# Patient Record
Sex: Female | Born: 1935 | Race: White | Hispanic: No | Smoking: Former smoker
Health system: Southern US, Community
[De-identification: ages and names within clinical notes are randomized; demographics above are authoritative.]

## PROBLEM LIST (undated history)

## (undated) DIAGNOSIS — C50919 Malignant neoplasm of unspecified site of unspecified female breast: Secondary | ICD-10-CM

## (undated) DIAGNOSIS — M1990 Unspecified osteoarthritis, unspecified site: Secondary | ICD-10-CM

## (undated) DIAGNOSIS — F329 Major depressive disorder, single episode, unspecified: Secondary | ICD-10-CM

## (undated) DIAGNOSIS — I219 Acute myocardial infarction, unspecified: Secondary | ICD-10-CM

## (undated) DIAGNOSIS — R011 Cardiac murmur, unspecified: Secondary | ICD-10-CM

## (undated) DIAGNOSIS — E785 Hyperlipidemia, unspecified: Secondary | ICD-10-CM

## (undated) DIAGNOSIS — R0602 Shortness of breath: Secondary | ICD-10-CM

## (undated) DIAGNOSIS — R51 Headache: Secondary | ICD-10-CM

## (undated) DIAGNOSIS — F32A Depression, unspecified: Secondary | ICD-10-CM

## (undated) DIAGNOSIS — C3490 Malignant neoplasm of unspecified part of unspecified bronchus or lung: Secondary | ICD-10-CM

## (undated) DIAGNOSIS — C6990 Malignant neoplasm of unspecified site of unspecified eye: Secondary | ICD-10-CM

## (undated) DIAGNOSIS — I10 Essential (primary) hypertension: Secondary | ICD-10-CM

## (undated) DIAGNOSIS — I2510 Atherosclerotic heart disease of native coronary artery without angina pectoris: Secondary | ICD-10-CM

## (undated) HISTORY — PX: CARDIAC CATHETERIZATION: SHX172

## (undated) HISTORY — DX: Essential (primary) hypertension: I10

## (undated) HISTORY — DX: Malignant neoplasm of unspecified site of unspecified eye: C69.90

## (undated) HISTORY — DX: Malignant neoplasm of unspecified site of unspecified female breast: C50.919

## (undated) HISTORY — DX: Atherosclerotic heart disease of native coronary artery without angina pectoris: I25.10

## (undated) HISTORY — PX: MASS EXCISION: SHX2000

## (undated) HISTORY — DX: Hyperlipidemia, unspecified: E78.5

## (undated) HISTORY — DX: Malignant neoplasm of unspecified part of unspecified bronchus or lung: C34.90

## (undated) HISTORY — PX: EYE SURGERY: SHX253

---

## 1988-10-31 HISTORY — PX: MASTECTOMY: SHX3

## 2011-05-06 ENCOUNTER — Inpatient Hospital Stay (HOSPITAL_COMMUNITY)
Admission: EM | Admit: 2011-05-06 | Discharge: 2011-05-11 | DRG: 247 | Disposition: A | Discharge status: Home / Self Care | Payer: Medicare Other | Attending: Cardiovascular Disease | Admitting: Cardiovascular Disease

## 2011-05-06 DIAGNOSIS — R079 Chest pain, unspecified: Secondary | ICD-10-CM

## 2011-05-06 DIAGNOSIS — I2582 Chronic total occlusion of coronary artery: Secondary | ICD-10-CM | POA: Diagnosis present

## 2011-05-06 DIAGNOSIS — I2510 Atherosclerotic heart disease of native coronary artery without angina pectoris: Secondary | ICD-10-CM | POA: Diagnosis present

## 2011-05-06 DIAGNOSIS — Z85840 Personal history of malignant neoplasm of eye: Secondary | ICD-10-CM

## 2011-05-06 DIAGNOSIS — Z853 Personal history of malignant neoplasm of breast: Secondary | ICD-10-CM

## 2011-05-06 DIAGNOSIS — E785 Hyperlipidemia, unspecified: Secondary | ICD-10-CM | POA: Diagnosis present

## 2011-05-06 DIAGNOSIS — I2119 ST elevation (STEMI) myocardial infarction involving other coronary artery of inferior wall: Principal | ICD-10-CM | POA: Diagnosis present

## 2011-05-06 DIAGNOSIS — I10 Essential (primary) hypertension: Secondary | ICD-10-CM | POA: Diagnosis present

## 2011-05-06 LAB — CBC
HCT: 35.9 % — ABNORMAL LOW (ref 36.0–46.0)
HCT: 27 % — ABNORMAL LOW (ref 36.0–46.0)
HCT: 32.1 % — ABNORMAL LOW (ref 36.0–46.0)
Hemoglobin: 9.4 g/dL — ABNORMAL LOW (ref 12.0–15.0)
MCH: 30.8 pg (ref 26.0–34.0)
MCH: 30.5 pg (ref 26.0–34.0)
MCHC: 33.6 g/dL (ref 30.0–36.0)
MCV: 88.4 fL (ref 78.0–100.0)
MCV: 87.7 fL (ref 78.0–100.0)
Platelets: 190 10*3/uL (ref 150–400)
Platelets: 171 10*3/uL (ref 150–400)
Platelets: 183 10*3/uL (ref 150–400)
RBC: 4.06 MIL/uL (ref 3.87–5.11)
RBC: 3.08 MIL/uL — ABNORMAL LOW (ref 3.87–5.11)
RDW: 14.6 % (ref 11.5–15.5)
WBC: 6.6 10*3/uL (ref 4.0–10.5)
WBC: 8.3 10*3/uL (ref 4.0–10.5)

## 2011-05-06 LAB — COMPREHENSIVE METABOLIC PANEL
ALT: 14 U/L (ref 0–35)
AST: 22 U/L (ref 0–37)
Albumin: 2.5 g/dL — ABNORMAL LOW (ref 3.5–5.2)
Calcium: 7.7 mg/dL — ABNORMAL LOW (ref 8.4–10.5)
Creatinine, Ser: 0.72 mg/dL (ref 0.50–1.10)
GFR calc non Af Amer: >60 mL/min (ref >60)
Sodium: 137 mEq/L (ref 135–145)
Total Protein: 5.9 g/dL — ABNORMAL LOW (ref 6.0–8.3)

## 2011-05-06 LAB — HEMOGLOBIN A1C: Hgb A1c MFr Bld: 6.2 % — ABNORMAL HIGH (ref <5.7)

## 2011-05-06 LAB — CARDIAC PANEL(CRET KIN+CKTOT+MB+TROPI)
CK, MB: 19.3 ng/mL (ref 0.3–4.0)
Relative Index: 8.6 — ABNORMAL HIGH (ref 0.0–2.5)
Total CK: 224 U/L — ABNORMAL HIGH (ref 7–177)
Troponin I: 1.42 ng/mL (ref <0.30)

## 2011-05-06 LAB — LIPID PANEL
HDL: 35 mg/dL — ABNORMAL LOW (ref >39)
LDL Cholesterol: 112 mg/dL — ABNORMAL HIGH (ref 0–99)
Total CHOL/HDL Ratio: 4.6 RATIO
Triglycerides: 69 mg/dL (ref <150)

## 2011-05-06 LAB — DIFFERENTIAL
Eosinophils Absolute: 0 10*3/uL (ref 0.0–0.7)
Eosinophils Relative: 1 % (ref 0–5)
Lymphocytes Relative: 13 % (ref 12–46)
Lymphs Abs: 1.1 10*3/uL (ref 0.7–4.0)
Monocytes Relative: 3 % (ref 3–12)
Neutrophils Relative %: 84 % — ABNORMAL HIGH (ref 43–77)

## 2011-05-06 LAB — BASIC METABOLIC PANEL
CO2: 24 mEq/L (ref 19–32)
Calcium: 9.2 mg/dL (ref 8.4–10.5)
Chloride: 103 mEq/L (ref 96–112)
Glucose, Bld: 194 mg/dL — ABNORMAL HIGH (ref 70–99)
Sodium: 138 mEq/L (ref 135–145)

## 2011-05-06 LAB — MRSA PCR SCREENING: MRSA by PCR: NEGATIVE

## 2011-05-06 LAB — POCT I-STAT, CHEM 8
BUN: 14 mg/dL (ref 6–23)
Calcium, Ion: 1.13 mmol/L (ref 1.12–1.32)
Chloride: 107 mEq/L (ref 96–112)
Glucose, Bld: 200 mg/dL — ABNORMAL HIGH (ref 70–99)
Potassium: 3.5 mEq/L (ref 3.5–5.1)

## 2011-05-06 LAB — APTT
aPTT: 27 seconds (ref 24–37)
aPTT: 133 seconds — ABNORMAL HIGH (ref 24–37)

## 2011-05-07 LAB — CARDIAC PANEL(CRET KIN+CKTOT+MB+TROPI): Relative Index: 7.4 — ABNORMAL HIGH (ref 0.0–2.5)

## 2011-05-07 LAB — BASIC METABOLIC PANEL
BUN: 12 mg/dL (ref 6–23)
CO2: 25 mEq/L (ref 19–32)
CO2: 28 mEq/L (ref 19–32)
Calcium: 8.6 mg/dL (ref 8.4–10.5)
Chloride: 108 mEq/L (ref 96–112)
Chloride: 107 mEq/L (ref 96–112)
Creatinine, Ser: 0.76 mg/dL (ref 0.50–1.10)
Glucose, Bld: 83 mg/dL (ref 70–99)
Potassium: 3.7 mEq/L (ref 3.5–5.1)
Sodium: 141 mEq/L (ref 135–145)

## 2011-05-07 LAB — CBC
HCT: 28.9 % — ABNORMAL LOW (ref 36.0–46.0)
MCH: 30.6 pg (ref 26.0–34.0)
MCV: 88.4 fL (ref 78.0–100.0)
RBC: 3.27 MIL/uL — ABNORMAL LOW (ref 3.87–5.11)
WBC: 6.3 10*3/uL (ref 4.0–10.5)

## 2011-05-08 LAB — BASIC METABOLIC PANEL
CO2: 25 mEq/L (ref 19–32)
Chloride: 106 mEq/L (ref 96–112)
GFR calc non Af Amer: >60 mL/min (ref >60)
Glucose, Bld: 94 mg/dL (ref 70–99)
Potassium: 3.7 mEq/L (ref 3.5–5.1)
Sodium: 139 mEq/L (ref 135–145)

## 2011-05-08 LAB — CARDIAC PANEL(CRET KIN+CKTOT+MB+TROPI)
CK, MB: 27.8 ng/mL (ref 0.3–4.0)
Relative Index: 3.3 — ABNORMAL HIGH (ref 0.0–2.5)
Troponin I: 14.2 ng/mL (ref <0.30)

## 2011-05-09 LAB — CBC
MCH: 29.8 pg (ref 26.0–34.0)
Platelets: 161 10*3/uL (ref 150–400)
RBC: 3.26 MIL/uL — ABNORMAL LOW (ref 3.87–5.11)
WBC: 5.6 10*3/uL (ref 4.0–10.5)

## 2011-05-09 LAB — PROTIME-INR: Prothrombin Time: 14.2 seconds (ref 11.6–15.2)

## 2011-05-09 LAB — POCT ACTIVATED CLOTTING TIME: Activated Clotting Time: 358 seconds

## 2011-05-10 LAB — CBC
HCT: 28.7 % — ABNORMAL LOW (ref 36.0–46.0)
MCV: 88.9 fL (ref 78.0–100.0)
RDW: 14.6 % (ref 11.5–15.5)
WBC: 5.4 10*3/uL (ref 4.0–10.5)

## 2011-05-10 LAB — BASIC METABOLIC PANEL
BUN: 19 mg/dL (ref 6–23)
CO2: 26 mEq/L (ref 19–32)
Chloride: 106 mEq/L (ref 96–112)
Creatinine, Ser: 0.8 mg/dL (ref 0.50–1.10)
GFR calc Af Amer: >60 mL/min (ref >60)

## 2011-05-11 DIAGNOSIS — I2119 ST elevation (STEMI) myocardial infarction involving other coronary artery of inferior wall: Secondary | ICD-10-CM

## 2011-05-11 LAB — CBC
HCT: 28.5 % — ABNORMAL LOW (ref 36.0–46.0)
MCH: 30.6 pg (ref 26.0–34.0)
MCHC: 34.4 g/dL (ref 30.0–36.0)
MCV: 89.1 fL (ref 78.0–100.0)
RDW: 14.6 % (ref 11.5–15.5)

## 2011-05-11 LAB — BASIC METABOLIC PANEL
BUN: 21 mg/dL (ref 6–23)
Calcium: 8.4 mg/dL (ref 8.4–10.5)
Creatinine, Ser: 0.84 mg/dL (ref 0.50–1.10)
GFR calc Af Amer: >60 mL/min (ref >60)
GFR calc non Af Amer: >60 mL/min (ref >60)

## 2011-05-18 ENCOUNTER — Encounter: Payer: Self-pay | Admitting: Cardiovascular Disease

## 2011-05-19 ENCOUNTER — Other Ambulatory Visit (INDEPENDENT_AMBULATORY_CARE_PROVIDER_SITE_OTHER): Payer: Medicare Other | Admitting: *Deleted

## 2011-05-19 ENCOUNTER — Ambulatory Visit (INDEPENDENT_AMBULATORY_CARE_PROVIDER_SITE_OTHER): Payer: Medicare Other | Admitting: Cardiovascular Disease

## 2011-05-19 ENCOUNTER — Encounter: Payer: Self-pay | Admitting: Cardiovascular Disease

## 2011-05-19 VITALS — BP 133/83 | HR 72 | Ht 66.0 in | Wt 152.0 lb

## 2011-05-19 DIAGNOSIS — E7800 Pure hypercholesterolemia, unspecified: Secondary | ICD-10-CM | POA: Insufficient documentation

## 2011-05-19 DIAGNOSIS — I2510 Atherosclerotic heart disease of native coronary artery without angina pectoris: Secondary | ICD-10-CM

## 2011-05-19 DIAGNOSIS — I2119 ST elevation (STEMI) myocardial infarction involving other coronary artery of inferior wall: Secondary | ICD-10-CM

## 2011-05-19 NOTE — Assessment & Plan Note (Signed)
LDL cholesterol was 112. She was started on simvastatin in the hospital.

## 2011-05-19 NOTE — Cardiovascular Report (Signed)
NAMEJALEEYAH, MUNCE NO.:  [id number]  MEDICAL RECORD NO.:  [id number]  LOCATION:  2902                         FACILITY:  [facility]  PHYSICIAN:  Veverly Fells. Excell Seltzer, MD  DATE OF BIRTH:  17-May-1934  DATE OF PROCEDURE:  05/06/2011 DATE OF DISCHARGE:                           CARDIAC CATHETERIZATION   PROCEDURES: 1. Left heart catheterization. 2. Selective coronary angiography. 3. Left ventricular angiography. 4. percutaneous transluminal coronary angioplasty and stenting of the     right coronary artery.  PROCEDURAL INDICATIONS:  Inferoposterior MI.  PROCEDURAL DETAILS:  Risk and indications of the procedure were reviewed with the patient.  Emergency consent was obtained.  The right groin was prepped, draped and anesthetized with 1% lidocaine using modified Seldinger technique.  A 6-French sheath was placed in the right femoral artery.  The patient had an inferoposterior infarct, so I elected to initially go in with a JL-4 diagnostic catheter to image the left coronary artery.  A 6-French JR-4 guide catheter was used for the right coronary artery.  The patient was started immediately on bivalirudin. She was also loaded with Plavix 600 mg on the table.  There was high- grade stenosis in both the LAD and left circumflex but there was TIMI 3 flow present in both vessels.  I suspected the right was the culprit. There was TIMI 2 flow in the right coronary artery with a critical distal RCA stenosis.  The vessel was wired with a cougar guidewire after a therapeutic ACT was achieved.  The vessel was predilated with a 2.0 x 20-mm Emerge balloon which was taken to 6 and 8 atmospheres over the lesions.  There were sequential high grade stenoses.  The PDA was patent but the posterolateral branch appear to have abrupt occlusion, probably related to thrombus formation.  There is also severe stenosis in another small distal RCA branch that was left untreated.  The patient  was eventually started on Integrilin due to the heavy clot burden in the distal vessel.  After several angiographic images were obtained, a 2.5 x 24-mm Promus drug-eluting stent was carefully positioned at the distal right coronary artery just before the bifurcation of the PDA branch. This covered the lesion in its entirety.  There was a 50% lesion at the origin of the first RV marginal branch that I did not think needed to be treated.  The stent was deployed at 12 atmospheres and appeared well expanded.  It was then postdilated with a 2.75 x 20-mm Clifton Quantum Apex to 14 atmospheres.  There was an excellent angiographic result.  The first posterolateral branch had improvement in the flow.  The second posterolateral branch continued to be occluded.  The PDA branch had TIMI 3 flow.  Left ventriculography was then performed.  Pullback across the aortic valve was done.  The femoral arteriotomy was closed with Perclose device.  The patient tolerated the procedure well.  At the completion of the procedure, her chest pain was down to 1/10.  PROCEDURAL FINDINGS:  The aortic pressure 101/61 with a mean of 78, left ventricular pressure is 99/20.  Left mainstem is patent.  There is mild calcification, the left main divides into the LAD and left  circumflex.  The mainstem has diffuse irregularity but no significant stenosis.  LAD.  The LAD has diffuse luminal irregularities in the proximal aspect. There is no high-grade stenosis present.  There is calcification and 30- 40% stenoses.  The mid-LAD has an eccentric 80% plaque.  There is a second 80% stenosis at the junction of the mid and distal LAD.  The vessel goes down to wrap around the left ventricular apex.  The first diagonal branch has moderate diffuse stenosis of 70%.  Left circumflex.  The left circumflex has high-grade proximal stenosis of 90%.  There is calcification and eccentricity to the plaque.  The vessel supplies a large single  obtuse marginal branch.  Right coronary artery.  The RCA is patent.  Proximally there is a 50% focal stenosis in the midportion.  The distal portion has diffuse critical disease with very high-grade stenosis of 95%.  There is TIMI 2 flow beyond that.  The vessel divides into a PDA and 2 posterolateral branches that fill competitively from antegrade flow and left-to-right collaterals.  Left ventriculography shows mild inferobasal hypokinesis with left ventricular ejection fraction estimated at 50%.  FINAL ASSESSMENT: 1. Acute inferoposterior myocardial infarction secondary to subtotal     occlusion of the right coronary artery with successful primary     percutaneous intervention using a drug-eluting stent platform. 2. Severe proximal left circumflex stenosis. 3. Severe tandem lesions in the mid left anterior descending. 4. Mild segmental left ventricular contraction abnormality with     overall preserved left ventricular function.  RECOMMENDATIONS:  The patient will be monitored in the CCU.  She will receive post MI medical therapy.  We will review films with colleagues, but my initial impression is that she would be best treated with multivessel PCI.  Her proximal circumflex is a focal lesion that could likely have a very good stent out, the mid-LAD has tandem lesions around the area of potential LIMA insertion making in my opinion bypass less favorable.  Initially, she will be monitored and we will follow her clinical course and tentatively we will plan on staged PCI next week.     Veverly Fells. Excell Seltzer, MD     MDC/MEDQ  D:  05/06/2011  T:  05/07/2011  Job:  [id number]  Electronically Signed by Tonny Bollman MD on 05/19/2011 12:40:55 AM

## 2011-05-19 NOTE — Progress Notes (Signed)
HPI:  The This is a 77 year old woman presenting for followup evaluation. She was hospitalized July 6 or July 11 with an acute inferoposterior MI. She was initially treated with primary PCI using a drug-eluting stent to the distal right coronary artery. She also was found to have multivessel disease with severe focal stenosis of the left circumflex and sequential lesions in the mid and distal LAD. Her circumflex was treated in staged fashion with a drug-eluting stent. Her LAD was treated medically because of diffuse moderate disease. The patient presents today for followup.  She's been staying with her son in [place]. She lives around [place]. She's been walking at a slow pace 15-20 minutes at a time and denies any symptoms with walking. She does get tired but does not have specific symptoms of shortness of breath or chest pain or tightness. She has no symptoms reminiscent of her MI. She notes easy bruising. She denies edema, orthopnea, PND, palpitations, lightheadedness, or syncope. She has a lot of questions today about the safety of returning home. [place].  The patient had P2Y12 testing done in the hospital and this demonstrated a baseline PRU of 499, inhibited PRU of 336, with 33% platelet inhibition.  Outpatient Encounter Prescriptions as of 05/19/2011  Medication Sig Dispense Refill  . aspirin 81 MG tablet Take 81 mg by mouth daily.        . carvedilol (COREG) 3.125 MG tablet Take 3.125 mg by mouth 2 (two) times daily with a meal.        . clopidogrel (PLAVIX) 75 MG tablet Take 75 mg by mouth daily.        . nitroGLYCERIN (NITROSTAT) 0.4 MG SL tablet Place 0.4 mg under the tongue every 5 (five) minutes as needed. For chest pains up to 3 doses. If pain still persist call emergency personal.       . simvastatin (ZOCOR) 40 MG tablet Take 40 mg by mouth at bedtime.          Allergies  Allergen Reactions  . Penicillins Hives    Past Medical History  Diagnosis  Date  . Hypertension   . Dyslipidemia   . Breast cancer   . Cancer of eye   . Myocardial infarct     ROS: Negative except as per HPI  BP 133/83  Pulse 72  Ht 5\' 6"  (1.676 m)  Wt 152 lb (68.947 kg)  BMI 24.53 kg/m2  PHYSICAL EXAM: Pt is alert and oriented, elderly woman in NAD HEENT: normal Neck: JVP - normal, carotids 2+= without bruits Lungs: CTA bilaterally CV: RRR without murmur or gallop Abd: soft, NT, Positive BS, no hepatomegaly Ext: no C/C/E, distal pulses intact and equal. Ecchymoses noted on both lower legs, nontender.  Skin: warm/dry no rash  EKG:  Normal sinus rhythm with possible left atrial enlargement, T wave abnormality consider inferior ischemia.  ASSESSMENT AND PLAN:

## 2011-05-19 NOTE — Patient Instructions (Signed)
Your physician recommends that you schedule a follow-up appointment in: 6-8 weeks with Dr. Excell Seltzer.  Your physician has requested that you have a lexiscan myoview. For further information please visit [email or web address] Please follow instruction sheet, as given.  You had lab work done today.

## 2011-05-19 NOTE — H&P (Signed)
NAMECRISTEN, [name] NO.:  [id number]  MEDICAL RECORD NO.:  [id number]  LOCATION:  2902                         FACILITY:  [facility]  PHYSICIAN:  Veverly Fells. Excell Seltzer, MD  DATE OF BIRTH:  Nov 30, 1933  DATE OF ADMISSION:  05/06/2011 DATE OF DISCHARGE:                             HISTORY & PHYSICAL   REASON FOR ADMISSION:  Chest pain.  HISTORY OF PRESENT ILLNESS:  Ms. [name] is a 77 year old woman who resides in [place].  She is temporarily in [place] visiting her son. She developed substernal chest pressure, diaphoresis, nausea, and vomiting this morning at 10:00 a.m.  She called her sister who came over to check on her.  They went to [facility] and she underwent a brief evaluation there and she then was advised to go directly to the [facility] Emergency Department.  EMS was reportedly offered to the patient, but she declined.  Upon arrival to the emergency department, an EKG was done in the triage area and this documented ST elevation infarction.  A Code STEMI was called and the patient was brought emergently to the cardiac cath lab.  Upon arrival to the cardiac cath lab, the patient complained of continued 5/10 chest discomfort.  She reports no anginal symptoms prior to her episode this morning.  She has no past cardiac history.  She has no other acute complaints.  She specifically denies dyspnea, palpitations, or syncope.  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  No other hospitalizations or surgeries are reported.  MEDICATIONS:  Currently unknown.  ALLERGIES:  PENICILLIN causes hives.  FAMILY HISTORY:  Negative for premature coronary artery disease.  SOCIAL HISTORY:  The patient lives in [place].  Her son lives locally in [place].  The patient is retired from a [employer].  She was long- time smoker  but quit about 20 years ago.  She does not drink alcohol.  REVIEW OF SYSTEMS:  Negative except as per HPI.  PHYSICAL EXAMINATION:  GENERAL:  The patient is alert and oriented, elderly woman in mild distress. HEENT: Normal. NECK:  Normal carotid upstrokes.  No bruits appreciated.  JVP normal. No thyromegaly or thyroid nodules. LUNGS: Clear to auscultation bilaterally. HEART:  Regular rate and rhythm.  No murmurs or gallops. ABDOMEN:  Soft, nontender.  No organomegaly. BACK:  No CVA tenderness. EXTREMITIES:  No clubbing, cyanosis, or edema.  Peripheral pulses are 2+ and equal. SKIN:  Warm and dry without rash.  EKG shows normal sinus rhythm with acute inferoposterior injury.  Lab work is currently pending.  Chest x-ray is currently pending.  ASSESSMENT: 1. This is a 77 year old woman with an acute inferoposterior     myocardial infarction.  Risks and indication of cardiac cath plus     or minus percutaneous coronary intervention were reviewed with the     patient.  Emergency consent was obtained.  Further plans pending     the result of her cardiac cath. 2. Hypertension.  I do not know the patient's home medications.  We     will obtain these for review and  treat her with appropriate post     myocardial infarction medical therapy, which likely will clued     include a beta-blocker and ACE inhibitor. 3. Hyperlipidemia.  We will repeat lipids and start high-dose statin     therapy.     Veverly Fells. Excell Seltzer, MD     MDC/MEDQ  D:  05/06/2011  T:  05/07/2011  Job:  [id number]  Electronically Signed by Tonny Bollman MD on 05/19/2011 12:40:45 AM

## 2011-05-19 NOTE — Discharge Summary (Signed)
[name], [name] NO.:  [id number]  MEDICAL RECORD NO.:  [id number]  LOCATION:  2008                         FACILITY:  [facility]  PHYSICIAN:  Arturo Morton. Riley Kill, MD, FACCDATE OF BIRTH:  [birth year]  DATE OF ADMISSION:  05/06/2011 DATE OF DISCHARGE:  05/11/2011                              DISCHARGE SUMMARY   PRIMARY CARDIOLOGIST:  Veverly Fells. Excell Seltzer, MD  DISCHARGE DIAGNOSIS:  Acute inferoposterior myocardial infarction.  SECONDARY DIAGNOSES: 1. Coronary artery disease, status post successful percutaneous     coronary intervention and drug-eluting stent placement to the     distal right coronary artery with elective drug-eluting stent     placement to the left circumflex this admission. 2. History of hypertension. 3. Hyperlipidemia. 4. History of breast cancer, status post bilateral mastectomy. 5. 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  ALLERGIES:  PENICILLIN causes hives.  PROCEDURES: 1. Left heart cardiac catheterization performed emergently on May 06, 2011, revealing 10-99% stenosis in the distal RCA as well as a 90%     proximal stenosis in the left circumflex and 80% stenosis in the     LAD.  EF was 50% with moderate inferobasal hypokinesis.  The RCA     was felt to be the infarct vessel and this was successfully stented     with a 2.5 x 24-mm Promus Element Plus drug-eluting stent. 2. Stage percutaneous intervention to the left circumflex with     placement of 2.75 x 20-mm Promus Element Plus drug-eluting stent.     Relook catheterization on the right coronary artery showed     resolution of thrombus with patent stent.  The left LAD was     medically managed.  HISTORY OF PRESENT ILLNESS:  A 77 year old female without prior cardiac history who lives in [place], but is temporarily residing with her son. She was in her usual state of health until the morning of May 06, 2011, when she had a sudden onset of substernal chest  discomfort, diaphoresis, nausea, and vomiting.  The patient presented to [facility] and was advised to present to the [facility] ED.  EMS was reportedly offered, but the patient declined.  Upon arrival to the ED, the patient was noted to have inferior ST-segment elevation with an anterolateral ST-segment depression.  Code STEMI was called and the patient was taken to the Cath Lab emergently.  HOSPITAL COURSE:  The patient underwent emergent diagnostic catheterization revealing subtotal occlusion of the distal RCA along with 90% proximal stenosis in the left circumflex and 80% mid stenosis in the LAD.  The RCA was felt to be the infarct vessel and this was successfully stented with a 2.5 x 24-mm Promus Element Plus drug-eluting stent.  Films were reviewed and it was felt that staged PCI of the left circumflex and possibly LAD would be necessary.  The patient was monitored in the coronary intensive care unit postprocedurally where she eventually peaked her CK at 2149, MB at 159.2, and troponin-I greater than 25.  She was maintained on aspirin, statin, Plavix, and low-dose beta-blocker therapy all of which she has tolerated.  She went back to the  Cath Lab on May 09, 2011 where PCI of the proximal circumflex was performed with placement of 2.75 x 20-mm Promus Element Plus drug-eluting stent.  Relook angiography of the right coronary artery showed less than 20% in-stent restenosis with resolution of prior thrombus.  For the time being, the LAD was left for medical therapy.  Postprocedure, the patient has had no recurrent chest discomfort.  P2Y12 testing was performed showing 33% inhibition, which was felt to be adequate and Plavix was continued.  The patient has been seen by Cardiac Rehab and has been ambulating around floor without difficulty and will be discharged home today in good condition.  DISCHARGE LABS:  Hemoglobin 9.8, hematocrit 28.5, WBC 5.4, platelets 164, P2Y12  336 with 33% inhibition.  Sodium 139, potassium 4.1, chloride 108, CO2 25, BUN 21, creatinine 0.84, glucose 94.  Total bilirubin 0.1, alkaline phosphatase 55, AST 22, ALT 14, total protein 5.9, albumin 2.5, calcium 8.4, hemoglobin A1c 6.2.  CK 51, MB 27.8, troponin-I 14.20. Total cholesterol 161, triglycerides 69, HDL 35, LDL 112.  MRSA screen was negative.  DISPOSITION:  The patient will be discharged home today in good condition.  FOLLOWUP PLANS AND APPOINTMENTS:  We have arranged a follow up with Dr. Tonny Bollman on May 19, 2011 at 3:15 p.m.  The patient wants to CBC performed during that visit.  DISCHARGE MEDICATIONS: 1. Aspirin 81 mg daily. 2. Carvedilol 3.125 mg b.i.d. 3. Plavix 75 mg daily. 4. Nitroglycerin 0.4 mg sublingual p.r.n. chest pain. 5. Simvastatin 40 mg nightly.  OUTSTANDING LABS STUDIES:  Follow up lipids and LFTs in 6-8 weeks given new statin therapy.  CBC on May 19, 2011.  DURATION OF DISCHARGE ENCOUNTER:  Sixty minutes including physician time.     [name], ANP   ______________________________ Arturo Morton. Riley Kill, MD, [facility]    CB/MEDQ  D:  05/11/2011  T:  05/12/2011  Job:  [id number]  Electronically Signed by [name] ANP on 05/17/2011 04:48:00 PM Electronically Signed by Shawnie Pons MD [facility] on 05/19/2011 07:25:48 AM

## 2011-05-19 NOTE — Cardiovascular Report (Signed)
[name], [name] NO.:  [id number]  MEDICAL RECORD NO.:  [id number]  LOCATION:  2902                         FACILITY:  [facility]  PHYSICIAN:  Arturo Morton. Riley Kill, MD, FACCDATE OF BIRTH:  07-21-1934  DATE OF PROCEDURE: DATE OF DISCHARGE:                           CARDIAC CATHETERIZATION   INDICATIONS:  This nice lady presented on May 06, 2011 with inferoposterior wall myocardial infarction.  She underwent direct stenting.  She had multivessel disease with a critical lesion of the circumflex and multiple moderately high-grade lesions of the LAD.  It was felt that it would be difficult to discharge her from the hospital with the circumflex which is about 95% hazy.  I reviewed the findings with the patient and her family.  She was agreeable to proceed.  PROCEDURES: 1. Coronary arteriography. 2. Percutaneous stenting of the circumflex coronary artery with DES.  DESCRIPTION OF THE PROCEDURE:  The patient was brought to the catheterization laboratory and prepped and draped in the usual fashion. Through an anterior puncture, the left femoral artery was entered.  A 6- French sheath was placed.  Bivalirudin was given according to protocol. We used a diagnostic light to demonstrate continued patency of the infarct-related artery.  Attention was then turned to the left coronary system.  A CLS 3.5 guide was utilized.  ACT was checked and found to be appropriate.  A critical stenosis in the circumflex artery was noted as well as three tandem stenoses in the left anterior descending artery. After considerable review, I elected to treat the circumflex. Importantly, the patient has a hemoglobin of 9.7.  The lesion was then crossed with a traverse wire, and predilated with a 2.25-mm balloon. Stenting was accomplished using a 275 x 20 Promus element drug-eluting stent.  This was carefully deployed to 13 atmospheres, and then a 3.25 noncompliant balloon was used to post dilate  throughout the course of the stent to optimize size.  There were no major complications.  I reviewed the films with the patient's family and gave them pictures. She was taken to the holding area after sewing in the left femoral sheath.  There were no major complications.  ANGIOGRAPHIC DATA:  The left anterior descending artery has multiple lesions.  In the proximal vessel, there is 60-70% stenosis, which looks worse in the RAO views.  This is between the septal perforators and after the diagonal.  The diagonal itself has disease, which is diffuse and proximal.  The LAD after the takeoff of the diagonal after the first lesion between the septal perforators has another lesion of about 70% at the apex.  There is a third lesion of about 80% and the vessel wraps the apical tip.  Both appear to have the appearance of relatively stable lumens.  The right coronary artery demonstrates luminal irregularities and a widely patent stent.  There is 30-40% narrowing distally beyond the stent site.  CONCLUSIONS: 1. Continued patency of the infarct-related artery. 2. Successful percutaneous stenting with a drug-eluting platform of     the proximal high-grade circumflex. 3. Residual moderately high-grade multiple tandem lesions in the left     anterior descending artery.  DISPOSITION:  I will review the films with  Dr. Excell Seltzer on his return. With the patient's hemoglobin of 9.7, we will defer doing the LAD as it would require multiple stents and her ability to tolerate Plavix is unclear.  We will get a P2Y12 to assess platelet inhibition.  We will follow closely as an outpatient.     Arturo Morton. Riley Kill, MD, [facility]     TDS/MEDQ  D:  05/09/2011  T:  05/10/2011  Job:  [id number]  cc:   Veverly Fells. Excell Seltzer, MD CV Laboratory  Electronically Signed by Shawnie Pons MD [facility] on 05/19/2011 07:25:42 AM

## 2011-05-19 NOTE — Assessment & Plan Note (Signed)
I'm pleased with the patient early progress after her MI. Her blood pressure is well-controlled, she is having no angina, and she is tolerating medical therapy well. Have recommended continuing on her current medications without changes. She will have a Lexiscan Myoview stress study to evaluate for anterior ischemia in the setting of known LAD disease. If this study is low risk I would recommend continued medical management. I recommended that she stay in town with her son for a few more weeks and if she remains stable and feels well she could return home after that.  I would like to see her back in clinical followup in about 6 weeks.

## 2011-05-20 LAB — BASIC METABOLIC PANEL
CO2: 25 mEq/L (ref 19–32)
Calcium: 9 mg/dL (ref 8.4–10.5)
Chloride: 108 mEq/L (ref 96–112)
Sodium: 140 mEq/L (ref 135–145)

## 2011-05-20 LAB — CBC WITH DIFFERENTIAL/PLATELET
Basophils Absolute: 0 10*3/uL (ref 0.0–0.1)
Eosinophils Absolute: 0.3 10*3/uL (ref 0.0–0.7)
Lymphocytes Relative: 22.7 % (ref 12.0–46.0)
Monocytes Relative: 7.2 % (ref 3.0–12.0)
Platelets: 293 10*3/uL (ref 150.0–400.0)
RDW: 15.8 % — ABNORMAL HIGH (ref 11.5–14.6)

## 2011-06-01 ENCOUNTER — Ambulatory Visit (HOSPITAL_COMMUNITY): Payer: Medicare Other | Attending: Cardiovascular Disease | Admitting: Radiology

## 2011-06-01 VITALS — Ht 66.0 in | Wt 149.0 lb

## 2011-06-01 DIAGNOSIS — I2510 Atherosclerotic heart disease of native coronary artery without angina pectoris: Secondary | ICD-10-CM | POA: Insufficient documentation

## 2011-06-01 MED ORDER — REGADENOSON 0.4 MG/5ML IV SOLN
0.4000 mg | Freq: Once | INTRAVENOUS | Status: AC
  Administered 2011-06-01: 0.4 mg via INTRAVENOUS

## 2011-06-01 MED ORDER — TECHNETIUM TC 99M TETROFOSMIN IV KIT
11.0000 | PACK | Freq: Once | INTRAVENOUS | Status: AC | PRN
  Administered 2011-06-01: 11 via INTRAVENOUS

## 2011-06-01 MED ORDER — TECHNETIUM TC 99M TETROFOSMIN IV KIT
33.0000 | PACK | Freq: Once | INTRAVENOUS | Status: AC | PRN
  Administered 2011-06-01: 33 via INTRAVENOUS

## 2011-06-01 NOTE — Progress Notes (Signed)
[facility] SITE 3 NUCLEAR MED [address] [place] [address] [phone]  Cardiology Nuclear Med Study  [name] is a 77 y.o. female [id number] 06/02/1934   Nuclear Med Background Indication for Stress Test:  Evaluation for Ischemia, Stent Patency and Assess moderately high grade multiple tandem lesions in LAD (80%) History: 05/10/11 Heart Catheterization: EF 50% patent stent in RCA CFX 90%-stent LAD multi-lesions, 05/07/11 Myocardial Infarction: Inferoposterior MI STEMI and 05/07/11 stent to RCA    05/10/11 stent to CFX Stents Cardiac Risk Factors: History of Smoking, Hypertension and Lipids  Symptoms:  DOE, Fatigue, Palpitations and SOB   Nuclear Pre-Procedure Caffeine/Decaff Intake:  None NPO After: 6:00pm   Lungs:  clear IV 0.9% NS with Angio Cath:  20g  IV Site: L Wrist  IV Started by:  Irean Hong, RN  Chest Size (in):  36 Cup Size: A  Height: 5\' 6"  (1.676 m)  Weight:  149 lb (67.586 kg)  BMI:  Body mass index is 24.05 kg/(m^2). Tech Comments:  Took coreg this am    Nuclear Med Study 1 or 2 day study: 1 day  Stress Test Type:  Eugenie Birks  Reading MD: Charlton Haws, MD  Order Authorizing Provider:  M.Cooper  Resting Radionuclide: Technetium 83m Tetrofosmin  Resting Radionuclide Dose: 11.0 mCi   Stress Radionuclide:  Technetium 1m Tetrofosmin  Stress Radionuclide Dose: 33.0 mCi           Stress Protocol Rest HR: 60 Stress HR: 72  Rest BP: 128/71 Stress BP: 137/70  Exercise Time (min): n/a METS: n/a   Predicted Max HR: 146 bpm % Max HR: 49.32 bpm Rate Pressure Product: 9864   Dose of Adenosine (mg):  n/a Dose of Lexiscan: 0.4 mg  Dose of Atropine (mg): n/a Dose of Dobutamine: n/a mcg/kg/min (at max HR)  Stress Test Technologist: Milana Na, EMT-P  Nuclear Technologist:  Doyne Keel, CNMT     Rest Procedure:  Myocardial perfusion imaging was performed at rest 45 minutes following the intravenous administration of Technetium  53m Tetrofosmin. Rest ECG: NSR  Stress Procedure:  The patient received IV Lexiscan 0.4 mg over 15-seconds.  Technetium 88m Tetrofosmin injected at 30-seconds.  There were no significant changes with Lexiscan.  Quantitative spect images were obtained after a 45 minute delay. Stress ECG: No significant change from baseline ECG  QPS Raw Data Images:  Patient motion noted. Stress Images:  There is decreased uptake in the inferior wall. Rest Images:  There is decreased uptake in the inferior wall. Subtraction (SDS):  No reversibility is appreciated. Transient Ischemic Dilatation (Normal <1.22):  1.09 Lung/Heart Ratio (Normal <0.45):  0.24  Quantitative Gated Spect Images QGS EDV:  88 ml QGS ESV:  29 ml QGS cine images:  NL LV Function; NL Wall Motion QGS EF: 67%  Impression Exercise Capacity:  Lexiscan with no exercise. BP Response:  Normal blood pressure response. Clinical Symptoms:  Atypical chest pain. ECG Impression:  No significant ST segment change suggestive of ischemia. Comparison with Prior Nuclear Study: No images to compare  Overall Impression:  Small inferior wall infarct from apex to base with no ischemia      Charlton Haws

## 2011-06-02 NOTE — Progress Notes (Signed)
Nuclear report routed to Dr. Excell Seltzer. Fender Herder, Farris Has

## 2011-06-05 NOTE — Progress Notes (Signed)
No ischemia noted. Continue current medical therapy.

## 2011-06-07 ENCOUNTER — Telehealth: Payer: Self-pay | Admitting: Cardiovascular Disease

## 2011-06-07 DIAGNOSIS — E7800 Pure hypercholesterolemia, unspecified: Secondary | ICD-10-CM

## 2011-06-07 DIAGNOSIS — I2119 ST elevation (STEMI) myocardial infarction involving other coronary artery of inferior wall: Secondary | ICD-10-CM

## 2011-06-07 MED ORDER — CLOPIDOGREL BISULFATE 75 MG PO TABS: 75.0000 mg | ORAL_TABLET | Freq: Every day | ORAL | Status: DC

## 2011-06-07 MED ORDER — CARVEDILOL 3.125 MG PO TABS: 3.1250 mg | ORAL_TABLET | Freq: Two times a day (BID) | ORAL | Status: DC

## 2011-06-07 MED ORDER — SIMVASTATIN 40 MG PO TABS: 40.0000 mg | ORAL_TABLET | Freq: Every day | ORAL | Status: DC

## 2011-06-07 NOTE — Telephone Encounter (Signed)
Called and left message.

## 2011-06-07 NOTE — Progress Notes (Signed)
Left message to call back  

## 2011-06-07 NOTE — Telephone Encounter (Signed)
Spoke with son and pharmacy is [facility] in [place] [place]--[phone]. Son would like to get 90 day supply if possible. I verified with pharmacy that they could fill 90 day supply.  Will refill cardiac meds. Son also asking if Dr. Excell Seltzer feels pt can return to her house in [place] ( she has been staying with him).  Will check with Dr. Excell Seltzer.  Pt's son's number is [phone]

## 2011-06-07 NOTE — Telephone Encounter (Signed)
Spoke with pt and gave her results of nuclear study.  Pt states she would like to transfer her prescriptions to drug store in [facility] which she thinks is called [facility]. States she will need a year of refills to get discounted rate.  Pt does not know if she needs this to be a 3 month supply or if it can be filled monthly. She will have her son call us with this information and also pharmacy name.

## 2011-06-07 NOTE — Progress Notes (Signed)
Pt.notified

## 2011-06-07 NOTE — Telephone Encounter (Signed)
Returning call to speak with nurse.

## 2011-07-14 ENCOUNTER — Encounter: Payer: Self-pay | Admitting: Cardiovascular Disease

## 2011-07-14 ENCOUNTER — Ambulatory Visit (INDEPENDENT_AMBULATORY_CARE_PROVIDER_SITE_OTHER): Payer: Medicare Other | Admitting: Cardiovascular Disease

## 2011-07-14 DIAGNOSIS — I2119 ST elevation (STEMI) myocardial infarction involving other coronary artery of inferior wall: Secondary | ICD-10-CM

## 2011-07-14 DIAGNOSIS — I2510 Atherosclerotic heart disease of native coronary artery without angina pectoris: Secondary | ICD-10-CM

## 2011-07-14 DIAGNOSIS — E7800 Pure hypercholesterolemia, unspecified: Secondary | ICD-10-CM

## 2011-07-14 NOTE — Progress Notes (Signed)
HPI:  77 year old woman presenting for follow up evaluation. The patient has coronary artery disease and presented with an acute inferoposterior myocardial infarction in July 2012. She was treated with primary PCI utilizing a drug-eluting stent in the right coronary artery. She subsequently underwent staged PCI left circumflex 3 critical stenosis in that vessel. She had moderately tight disease in her LAD and this was treated medically. The patient underwent a follow up Myoview stress test demonstrated a small inferior wall infarct with no ischemia.  LVEF was 67%.  The patient is returned to her home in [place]. She is back to her previous lifestyle and has been out working in her flowers. She denies chest pain, edema, palpitations, or lightheadedness. She has chronic dyspnea and attributes this to many years working in a [employer]. She is a former smoker but has not smoked in several years.  Outpatient Encounter Prescriptions as of 07/14/2011  Medication Sig Dispense Refill  . aspirin 81 MG tablet Take 81 mg by mouth daily.        . carvedilol (COREG) 3.125 MG tablet Take 1 tablet (3.125 mg total) by mouth 2 (two) times daily with a meal.  180 tablet  3  . clopidogrel (PLAVIX) 75 MG tablet Take 1 tablet (75 mg total) by mouth daily.  90 tablet  3  . nitroGLYCERIN (NITROSTAT) 0.4 MG SL tablet Place 0.4 mg under the tongue every 5 (five) minutes as needed. For chest pains up to 3 doses. If pain still persist call emergency personal.       . simvastatin (ZOCOR) 40 MG tablet Take 1 tablet (40 mg total) by mouth at bedtime.  90 tablet  3    Allergies  Allergen Reactions  . Penicillins Hives    Past Medical History  Diagnosis Date  . Hypertension   . Dyslipidemia   . Breast cancer   . Cancer of eye   . Myocardial infarct     ROS: Negative except as per HPI  BP 122/72  Pulse 69  Ht 5\' 6"  (1.676 m)  Wt 151 lb 12.8 oz (68.856 kg)  BMI 24.50 kg/m2  PHYSICAL EXAM: Pt is alert and  oriented, NAD HEENT: normal Neck: JVP - normal, carotids 2+= without bruits Lungs: CTA bilaterally CV: RRR without murmur or gallop Abd: soft, NT, Positive BS, no hepatomegaly Ext: no C/C/E, distal pulses intact and equal Skin: warm/dry no rash  EKG:  Normal sinus rhythm 69 beats per minute, T wave abnormality consider inferior ischemia, otherwise within normal limits.  ASSESSMENT AND PLAN:

## 2011-07-14 NOTE — Patient Instructions (Signed)
Your physician recommends that you schedule a follow-up appointment in: 3 months.  Your physician recommends that you return for fasting lab work tomorrow--Lipid,Liver--272.0

## 2011-07-14 NOTE — Assessment & Plan Note (Signed)
Lipids will be redrawn in the morning. The patient is on simvastatin. Her goal LDL is less than 70.

## 2011-07-14 NOTE — Assessment & Plan Note (Signed)
The patient is stable without angina. Her Myoview scan was low risk and demonstrated no ischemia as well as preserved left ventricular function. She was advised to continue her medical program as she is doing.

## 2011-07-15 ENCOUNTER — Other Ambulatory Visit (INDEPENDENT_AMBULATORY_CARE_PROVIDER_SITE_OTHER): Payer: Medicare Other | Admitting: *Deleted

## 2011-07-15 DIAGNOSIS — E7800 Pure hypercholesterolemia, unspecified: Secondary | ICD-10-CM

## 2011-07-15 LAB — HEPATIC FUNCTION PANEL
Alkaline Phosphatase: 64 U/L (ref 39–117)
Bilirubin, Direct: 0 mg/dL (ref 0.0–0.3)
Total Bilirubin: 0.5 mg/dL (ref 0.3–1.2)
Total Protein: 8 g/dL (ref 6.0–8.3)

## 2011-07-15 LAB — LIPID PANEL
HDL: 27.7 mg/dL — ABNORMAL LOW (ref >39.00)
LDL Cholesterol: 71 mg/dL (ref 0–99)
Total CHOL/HDL Ratio: 4

## 2011-07-21 ENCOUNTER — Telehealth: Payer: Self-pay | Admitting: Cardiovascular Disease

## 2011-07-21 NOTE — Telephone Encounter (Signed)
Pt calling to get results of blood test

## 2011-07-21 NOTE — Telephone Encounter (Signed)
Pt aware of lab results by phone.  

## 2011-10-13 ENCOUNTER — Ambulatory Visit (INDEPENDENT_AMBULATORY_CARE_PROVIDER_SITE_OTHER): Payer: Medicare Other | Admitting: Cardiovascular Disease

## 2011-10-13 ENCOUNTER — Encounter: Payer: Self-pay | Admitting: Cardiovascular Disease

## 2011-10-13 VITALS — BP 136/74 | HR 78 | Resp 18 | Ht 65.0 in | Wt 149.4 lb

## 2011-10-13 DIAGNOSIS — I2119 ST elevation (STEMI) myocardial infarction involving other coronary artery of inferior wall: Secondary | ICD-10-CM

## 2011-10-13 DIAGNOSIS — E7800 Pure hypercholesterolemia, unspecified: Secondary | ICD-10-CM

## 2011-10-13 MED ORDER — CARVEDILOL 3.125 MG PO TABS: 6.2500 mg | ORAL_TABLET | Freq: Two times a day (BID) | ORAL | Status: DC

## 2011-10-13 NOTE — Patient Instructions (Addendum)
Your physician recommends that you schedule a follow-up appointment in: 6 Months   Your physician has recommended you make the following change in your medication: Increase Coreg to 6.25mg  by mouth twice daily.

## 2011-10-13 NOTE — Progress Notes (Signed)
HPI:  [name] is a 77 year old woman presenting for followup of coronary artery disease. She presented with an acute inferoposterior MI in July 2012. She was treated with primary PCI of the right coronary artery using a drug-eluting stent and then underwent staged PCI of left circumflex also with drug-eluting stents. She had moderate LAD disease and is being treated medically for this. A followup Myoview stress scan showed a small inferior infarct and no significant ischemia with a left ventricular ejection fraction of 67%  The patient is doing well. She is back to living by herself in her [place] home. She denies exertional chest pain or pressure. She has mild dyspnea with exertion but this is unchanged over several years. She denies orthopnea, PND, palpitations, or claudication symptoms.  Outpatient Encounter Prescriptions as of 10/13/2011  Medication Sig Dispense Refill  . aspirin 81 MG tablet Take 81 mg by mouth daily.        . carvedilol (COREG) 3.125 MG tablet Take 1 tablet (3.125 mg total) by mouth 2 (two) times daily with a meal.  180 tablet  3  . clopidogrel (PLAVIX) 75 MG tablet Take 1 tablet (75 mg total) by mouth daily.  90 tablet  3  . nitroGLYCERIN (NITROSTAT) 0.4 MG SL tablet Place 0.4 mg under the tongue every 5 (five) minutes as needed. For chest pains up to 3 doses. If pain still persist call emergency personal.       . simvastatin (ZOCOR) 40 MG tablet Take 1 tablet (40 mg total) by mouth at bedtime.  90 tablet  3    Allergies  Allergen Reactions  . Penicillins Hives    Past Medical History  Diagnosis Date  . Hypertension   . Dyslipidemia   . Breast cancer   . Cancer of eye   . Myocardial infarct     ROS: Negative except as per HPI  BP 136/74  Pulse 78  Resp 18  Ht 5\' 5"  (1.651 m)  Wt 67.767 kg (149 lb 6.4 oz)  BMI 24.86 kg/m2  PHYSICAL EXAM: Pt is alert and oriented, NAD HEENT: normal Neck: JVP - normal, carotids 2+= without bruits Lungs: CTA  bilaterally CV: RRR without murmur or gallop Abd: soft, NT, Positive BS, no hepatomegaly Ext: no C/C/E, distal pulses intact and equal Skin: warm/dry no rash  ASSESSMENT AND PLAN:

## 2011-10-15 ENCOUNTER — Encounter: Payer: Self-pay | Admitting: Cardiovascular Disease

## 2011-10-15 NOTE — Assessment & Plan Note (Signed)
Her LDL cholesterol is near goal at 72. Her HDL is low. She continues on statin therapy.

## 2011-10-15 NOTE — Assessment & Plan Note (Signed)
The patient is stable without angina. Her stress Myoview scan showed no significant ischemia following her MI. She will continue on her current medical program except she will increase her carvedilol dose to 6.25 mg twice daily. I would like to see her back in 6 months for followup. She continues on dual antiplatelet therapy with aspirin and Plavix.

## 2011-11-04 ENCOUNTER — Other Ambulatory Visit: Payer: Self-pay | Admitting: Cardiovascular Disease

## 2011-11-04 DIAGNOSIS — I2119 ST elevation (STEMI) myocardial infarction involving other coronary artery of inferior wall: Secondary | ICD-10-CM

## 2011-11-04 MED ORDER — CARVEDILOL 3.125 MG PO TABS: 6.2500 mg | ORAL_TABLET | Freq: Two times a day (BID) | ORAL | Status: DC

## 2011-11-16 ENCOUNTER — Other Ambulatory Visit: Payer: Self-pay | Admitting: *Deleted

## 2011-11-16 DIAGNOSIS — I2119 ST elevation (STEMI) myocardial infarction involving other coronary artery of inferior wall: Secondary | ICD-10-CM

## 2011-11-16 MED ORDER — CARVEDILOL 6.25 MG PO TABS: 6.2500 mg | ORAL_TABLET | Freq: Two times a day (BID) | ORAL | Status: DC

## 2011-11-18 ENCOUNTER — Other Ambulatory Visit: Payer: Self-pay | Admitting: Cardiovascular Disease

## 2011-11-18 DIAGNOSIS — I2119 ST elevation (STEMI) myocardial infarction involving other coronary artery of inferior wall: Secondary | ICD-10-CM

## 2011-11-18 MED ORDER — CARVEDILOL 6.25 MG PO TABS: 6.2500 mg | ORAL_TABLET | Freq: Two times a day (BID) | ORAL | Status: DC

## 2011-11-18 NOTE — Telephone Encounter (Signed)
Fax Received. Refill Completed. [name] (R.M.A)   

## 2011-11-18 NOTE — Telephone Encounter (Signed)
Refill    Patient calling to refill carvedilol (COREG) 6.25 MG tablet prescription.  (2nd Call)  Verified pharmacy with patient

## 2012-01-17 ENCOUNTER — Encounter (HOSPITAL_COMMUNITY): Payer: Self-pay | Admitting: *Deleted

## 2012-01-17 ENCOUNTER — Telehealth: Payer: Self-pay

## 2012-01-17 ENCOUNTER — Emergency Department (HOSPITAL_COMMUNITY)
Admission: EM | Admit: 2012-01-17 | Discharge: 2012-01-17 | Disposition: A | Discharge status: Home Health | Payer: Medicare Other | Attending: Emergency Medicine | Admitting: Emergency Medicine

## 2012-01-17 ENCOUNTER — Emergency Department (HOSPITAL_COMMUNITY): Payer: Medicare Other

## 2012-01-17 ENCOUNTER — Other Ambulatory Visit: Payer: Self-pay

## 2012-01-17 ENCOUNTER — Encounter: Payer: Self-pay | Admitting: Oncology

## 2012-01-17 DIAGNOSIS — C33 Malignant neoplasm of trachea: Secondary | ICD-10-CM

## 2012-01-17 DIAGNOSIS — R0602 Shortness of breath: Secondary | ICD-10-CM | POA: Insufficient documentation

## 2012-01-17 DIAGNOSIS — E785 Hyperlipidemia, unspecified: Secondary | ICD-10-CM | POA: Insufficient documentation

## 2012-01-17 DIAGNOSIS — R918 Other nonspecific abnormal finding of lung field: Secondary | ICD-10-CM

## 2012-01-17 DIAGNOSIS — I10 Essential (primary) hypertension: Secondary | ICD-10-CM | POA: Insufficient documentation

## 2012-01-17 DIAGNOSIS — Z7982 Long term (current) use of aspirin: Secondary | ICD-10-CM | POA: Insufficient documentation

## 2012-01-17 DIAGNOSIS — R042 Hemoptysis: Secondary | ICD-10-CM | POA: Insufficient documentation

## 2012-01-17 DIAGNOSIS — I2510 Atherosclerotic heart disease of native coronary artery without angina pectoris: Secondary | ICD-10-CM | POA: Insufficient documentation

## 2012-01-17 DIAGNOSIS — I252 Old myocardial infarction: Secondary | ICD-10-CM | POA: Insufficient documentation

## 2012-01-17 DIAGNOSIS — Z79899 Other long term (current) drug therapy: Secondary | ICD-10-CM | POA: Insufficient documentation

## 2012-01-17 DIAGNOSIS — Z853 Personal history of malignant neoplasm of breast: Secondary | ICD-10-CM | POA: Insufficient documentation

## 2012-01-17 DIAGNOSIS — R070 Pain in throat: Secondary | ICD-10-CM | POA: Insufficient documentation

## 2012-01-17 DIAGNOSIS — Z85840 Personal history of malignant neoplasm of eye: Secondary | ICD-10-CM | POA: Insufficient documentation

## 2012-01-17 DIAGNOSIS — R222 Localized swelling, mass and lump, trunk: Secondary | ICD-10-CM | POA: Insufficient documentation

## 2012-01-17 DIAGNOSIS — R079 Chest pain, unspecified: Secondary | ICD-10-CM | POA: Insufficient documentation

## 2012-01-17 DIAGNOSIS — G8929 Other chronic pain: Secondary | ICD-10-CM | POA: Insufficient documentation

## 2012-01-17 LAB — BASIC METABOLIC PANEL
Calcium: 9.1 mg/dL (ref 8.4–10.5)
GFR calc Af Amer: >90 mL/min (ref >90)
GFR calc non Af Amer: 86 mL/min — ABNORMAL LOW (ref >90)
Potassium: 3.5 mEq/L (ref 3.5–5.1)
Sodium: 143 mEq/L (ref 135–145)

## 2012-01-17 LAB — CBC
Hemoglobin: 11.9 g/dL — ABNORMAL LOW (ref 12.0–15.0)
RBC: 3.96 MIL/uL (ref 3.87–5.11)
WBC: 4.6 10*3/uL (ref 4.0–10.5)

## 2012-01-17 LAB — PROTIME-INR: Prothrombin Time: 14.7 seconds (ref 11.6–15.2)

## 2012-01-17 LAB — APTT: aPTT: 31 seconds (ref 24–37)

## 2012-01-17 MED ORDER — BENZONATATE 100 MG PO CAPS: 200.0000 mg | ORAL_CAPSULE | Freq: Two times a day (BID) | ORAL | Status: AC | PRN

## 2012-01-17 MED ORDER — IOHEXOL 350 MG/ML SOLN
80.0000 mL | Freq: Once | INTRAVENOUS | Status: AC | PRN
  Administered 2012-01-17: 80 mL via INTRAVENOUS

## 2012-01-17 NOTE — Progress Notes (Signed)
CT scan and CXR from 01/17/12 reviewed with the radiologist.  Looks like a new lung cancer in mid right lung field with a right adrenal met--good site for possible core needle biopsy.  To do PET scan--scheduled.  Looks like patient has undergone bilateral mastectomies.

## 2012-01-17 NOTE — ED Notes (Signed)
TRANSPORTED TO CT SCAN.  

## 2012-01-17 NOTE — ED Notes (Signed)
CT NOTIFIED THAT PT. HAS 2ND IV AT RIGHT AC AND READY FOR TRANSPORT.

## 2012-01-17 NOTE — ED Notes (Signed)
Family remains at the bedside. No complaints currently. Waiting on md to call back to create plan of care.

## 2012-01-17 NOTE — ED Notes (Signed)
o2 sats checked at nurse first; spo2 97% and HR 84 on portable pulse ox

## 2012-01-17 NOTE — ED Provider Notes (Signed)
History     CSN: [id number]  Arrival date & time 01/17/12  4098   First MD Initiated Contact with Patient 01/17/12 [phone]      Chief Complaint  Patient presents with  . coughing up blood     (Consider location/radiation/quality/duration/timing/severity/associated sxs/prior treatment) HPI Comments: 78 year old female with a history of hypertension, breast cancer, coronary disease who presents with a complaint of sore throat, cough and hemoptysis. According to the patient this was acute in onset approximately 5 hours prior to arrival. She states that it woke her up from sleep, has been intermittent but persistent over the 5 hours. She denies associated back pain, leg swelling, travel, trauma, exposure to sick people, fevers chills nausea or vomiting. She is on Plavix for her heart disease and has chronic left chest pain status post mastectomy  The history is provided by the patient and a relative.    Past Medical History  Diagnosis Date  . Hypertension   . Dyslipidemia   . Breast cancer   . Cancer of eye   . Myocardial infarct     Past Surgical History  Procedure Date  . Mastectomy     Bilateral  . Eye surgery     Family History  Problem Relation Age of Onset  . Coronary artery disease Neg Hx     Premature    History  Substance Use Topics  . Smoking status: Former Games developer  . Smokeless tobacco: Not on file   Comment: Long time smoker, quit 20 years ago  . Alcohol Use: No    OB History    Grav Para Term Preterm Abortions TAB SAB Ect Mult Living                  Review of Systems  All other systems reviewed and are negative.    Allergies  Penicillins  Home Medications   Current Outpatient Rx  Name Route Sig Dispense Refill  . ASPIRIN 81 MG PO TABS Oral Take 81 mg by mouth daily.      Marland Kitchen CARVEDILOL 6.25 MG PO TABS Oral Take 1 tablet (6.25 mg total) by mouth 2 (two) times daily with a meal. 90 tablet 3  . CLOPIDOGREL BISULFATE 75 MG PO TABS Oral Take 1  tablet (75 mg total) by mouth daily. 90 tablet 3  . SIMVASTATIN 40 MG PO TABS Oral Take 1 tablet (40 mg total) by mouth at bedtime. 90 tablet 3  . BENZONATATE 100 MG PO CAPS Oral Take 2 capsules (200 mg total) by mouth 2 (two) times daily as needed for cough. 20 capsule 0  . NITROGLYCERIN 0.4 MG SL SUBL Sublingual Place 0.4 mg under the tongue every 5 (five) minutes as needed. For chest pains up to 3 doses. If pain still persist call emergency personal.       BP 163/79  Pulse 67  Temp(Src) 98.8 F (37.1 C) (Oral)  Resp 18  SpO2 97%  Physical Exam  Nursing note and vitals reviewed. Constitutional: She appears well-developed and well-nourished. No distress.  HENT:  Head: Normocephalic and atraumatic.  Mouth/Throat: Oropharynx is clear and moist. No oropharyngeal exudate.  Eyes: Conjunctivae and EOM are normal. Pupils are equal, round, and reactive to light. Right eye exhibits no discharge. Left eye exhibits no discharge. No scleral icterus.  Neck: Normal range of motion. Neck supple. No JVD present. No thyromegaly present.  Cardiovascular: Normal rate, regular rhythm, normal heart sounds and intact distal pulses.  Exam reveals no gallop and no  friction rub.   No murmur heard. Pulmonary/Chest: Effort normal and breath sounds normal. No respiratory distress. She has no wheezes. She has no rales.  Abdominal: Soft. Bowel sounds are normal. She exhibits no distension and no mass. There is no tenderness.  Musculoskeletal: Normal range of motion. She exhibits no edema and no tenderness.  Lymphadenopathy:    She has no cervical adenopathy.  Neurological: She is alert. Coordination normal.  Skin: Skin is warm and dry. No rash noted. No erythema.  Psychiatric: She has a normal mood and affect. Her behavior is normal.    ED Course  Procedures (including critical care time)  Labs Reviewed  CBC - Abnormal; Notable for the following:    Hemoglobin 11.9 (*)    HCT 34.9 (*)    All other  components within normal limits  BASIC METABOLIC PANEL - Abnormal; Notable for the following:    GFR calc non Af Amer 86 (*)    All other components within normal limits  APTT  PROTIME-INR   Dg Chest 2 View  01/17/2012  *RADIOLOGY REPORT*  Clinical Data: Hemoptysis; shortness of breath.  Left-sided chest pain.  History of smoking.  CHEST - 2 VIEW  Comparison: None.  Findings: The lungs are well-aerated.  A 2.0 cm nodular density is noted at the left lung base.  A lung mass cannot be excluded, though this could reflect the left-sided nipple shadow.  No definite focal consolidation, pleural effusion or pneumothorax is seen. There is nonspecific thickening of both major fissures.  The heart is borderline normal in size; the mediastinal contour is within normal limits.  No acute osseous abnormalities are seen. Scattered clips are noted at both axilla.  IMPRESSION: 2.0 cm nodular density at the left lung base.  A lung mass cannot be excluded, though this could also reflect the left-sided nipple shadow.  Follow-up chest radiograph with nipple markers would be helpful for further evaluation.  Original Report Authenticated By: Tonia Ghent, M.D.   Ct Angio Chest W/cm &/or Wo Cm  01/17/2012  *RADIOLOGY REPORT*  Clinical Data: Hemoptysis; left-sided chest pain and shortness of breath.  CT ANGIOGRAPHY CHEST  Technique:  Multidetector CT imaging of the chest using the standard protocol during bolus administration of intravenous contrast. Multiplanar reconstructed images including MIPs were obtained and reviewed to evaluate the vascular anatomy.  Contrast: 80mL OMNIPAQUE IOHEXOL 350 MG/ML IV SOLN  Comparison: Chest radiograph performed earlier today at 03:51 a.m.  Findings: There is no evidence of pulmonary embolus.  There is a multilobulated soft tissue mass noted at and below the right hilum, with extension peripherally along the right major fissure.  Given extension along the fissure, the mass measures  approximately 10 cm in length, and approximately 2.9 x 2.3 cm near the right hilum.  The appearance raises concern for bronchogenic malignancy.  Some of the extensions appear to fill and expand distal bronchioles.  No additional masses are identified.  The focal density overlying the left lung base on prior chest radiograph is not well characterized on CT, but may have reflected costochondral calcification.  Mild bibasilar atelectasis is noted; minimal nodularity at the lung bases likely reflects atelectasis.  There is no evidence of pleural effusion or pneumothorax.  Scattered calcification is noted along the descending thoracic aorta.  Dense calcification is noted along the coronary arteries. The mediastinum is otherwise unremarkable in appearance.  Scattered mediastinal nodes remain borderline normal in size; no mediastinal lymphadenopathy is seen.  Though the mass arises at the  right hilum, no separate hilar nodes are identified.  No pericardial effusion is identified.  The great vessels are grossly unremarkable in appearance.  No axillary lymphadenopathy is seen.  The thyroid gland is unremarkable in appearance.  Scattered clips are noted at both axilla.  The visualized portions of the liver and spleen are unremarkable. A somewhat complex 3.3 cm mass is noted at the right adrenal gland. This contains a central focus of increased attenuation, and is nonspecific in appearance.  Scattered diverticulosis is noted along the visualized portions of the colon.  No acute osseous abnormalities are seen.  IMPRESSION:  1.  No evidence of pulmonary embolus. 2.  Multilobulated soft tissue mass at and below the right hilum, extending peripherally along the right major fissure.  Given extension along the fissure, the mass measures approximately 10 cm in length, and 2.9 x 2.3 cm near the right hilum.  This is concerning for bronchogenic malignancy; the mass appears to fill and expand distal bronchioles. 3.  Mild bibasilar  atelectasis noted. 4.  Dense calcification along the coronary arteries. 5.  Somewhat complex 3.3 cm mass of the right adrenal gland, with a central focus of increased attenuation; this is nonspecific in appearance.  Metastatic disease cannot be entirely excluded. 6.  Scattered diverticulosis along the visualized portions of the colon.  These results were called by telephone on 01/17/2012  at  07:02 a.m. to  Dr. Eber Hong, who verbally acknowledged these results.  Original Report Authenticated By: Tonia Ghent, M.D.     1. Lung mass   2. Hemoptysis       MDM  Oropharynx, and nasal passages appear clear without signs of bleeding, mucous membranes are moist, lungs are clear without rales, heart rate is regular without murmurs, there is no peripheral edema. Patient has no tachycardia, no fever but has had this acute onset of cough and hemoptysis which will need to be reevaluated with x-ray and labs.  I have discussed with Dr. Arline Asp the findings of the CT scan - pt has been informed - office to arrange follow up appointment.  Pt has normal VS, normal Sat's and normal pulse.  Tessalon given for home to reduce cough.  Imaging reviewed:  Bronchogenic mass, c/w cancer  REsults reviewed - no significant findings.        Vida Roller, MD 01/17/12 [phone]

## 2012-01-17 NOTE — Telephone Encounter (Signed)
Opened by error.

## 2012-01-17 NOTE — ED Notes (Signed)
Pt talking with family; no needs at this time; family at bedside

## 2012-01-17 NOTE — ED Notes (Signed)
She woke up coughing up bright and dark colored blood.  No pain

## 2012-01-17 NOTE — Telephone Encounter (Signed)
S/w Caryn Bee that we are working on getting pt scheduled with [facility] and it is tentatively 3/27 at 1100 but we will call and confirm this.

## 2012-01-17 NOTE — Discharge Instructions (Signed)
Your CT scan showed that you have a mass on the right lung that is concerning for new cancerous lesion.  I have discussed your care with the Cancer doctor - Dr. Arline Asp who will have his office call you today to arrange follow up - you will need to be seen in the office and have further testing and possible biopsy - if you don't get a call today, please call the office in the morning to arrange follow up..  Return to the ER for severe or worsening bleeding, coughing, fevers or chest pain.

## 2012-01-19 ENCOUNTER — Telehealth: Payer: Self-pay | Admitting: Oncology

## 2012-01-19 NOTE — Telephone Encounter (Signed)
l/m to call and confirm 3/27 new pt appt   aom

## 2012-01-20 ENCOUNTER — Other Ambulatory Visit: Payer: Self-pay

## 2012-01-23 ENCOUNTER — Telehealth: Payer: Self-pay | Admitting: Oncology

## 2012-01-23 ENCOUNTER — Other Ambulatory Visit: Payer: Self-pay | Admitting: Medical Oncology

## 2012-01-23 NOTE — Telephone Encounter (Signed)
kevin called and verified the appt   aom

## 2012-01-24 ENCOUNTER — Other Ambulatory Visit: Payer: Self-pay | Admitting: Medical Oncology

## 2012-01-25 ENCOUNTER — Telehealth: Payer: Self-pay | Admitting: Oncology

## 2012-01-25 ENCOUNTER — Ambulatory Visit: Payer: Medicare Other

## 2012-01-25 ENCOUNTER — Other Ambulatory Visit: Payer: Medicare Other | Admitting: Lab

## 2012-01-25 ENCOUNTER — Encounter: Payer: Self-pay | Admitting: Oncology

## 2012-01-25 ENCOUNTER — Ambulatory Visit (HOSPITAL_BASED_OUTPATIENT_CLINIC_OR_DEPARTMENT_OTHER): Payer: Medicare Other | Admitting: Oncology

## 2012-01-25 VITALS — BP 175/83 | HR 63 | Temp 97.8°F | Ht 65.0 in | Wt 144.1 lb

## 2012-01-25 DIAGNOSIS — Z853 Personal history of malignant neoplasm of breast: Secondary | ICD-10-CM

## 2012-01-25 DIAGNOSIS — C33 Malignant neoplasm of trachea: Secondary | ICD-10-CM

## 2012-01-25 DIAGNOSIS — R918 Other nonspecific abnormal finding of lung field: Secondary | ICD-10-CM | POA: Insufficient documentation

## 2012-01-25 DIAGNOSIS — R222 Localized swelling, mass and lump, trunk: Secondary | ICD-10-CM

## 2012-01-25 LAB — CBC WITH DIFFERENTIAL/PLATELET
Basophils Absolute: 0 10*3/uL (ref 0.0–0.1)
EOS%: 3.9 % (ref 0.0–7.0)
Eosinophils Absolute: 0.2 10*3/uL (ref 0.0–0.5)
HGB: 12.3 g/dL (ref 11.6–15.9)
MCH: 30.3 pg (ref 25.1–34.0)
NEUT#: 3.5 10*3/uL (ref 1.5–6.5)
RBC: 4.06 10*6/uL (ref 3.70–5.45)
RDW: 14.1 % (ref 11.2–14.5)
lymph#: 1.5 10*3/uL (ref 0.9–3.3)

## 2012-01-25 LAB — COMPREHENSIVE METABOLIC PANEL
AST: 25 U/L (ref 0–37)
Albumin: 3.9 g/dL (ref 3.5–5.2)
BUN: 14 mg/dL (ref 6–23)
Calcium: 9.1 mg/dL (ref 8.4–10.5)
Chloride: 106 mEq/L (ref 96–112)
Potassium: 3.7 mEq/L (ref 3.5–5.3)
Sodium: 140 mEq/L (ref 135–145)
Total Protein: 8.2 g/dL (ref 6.0–8.3)

## 2012-01-25 NOTE — Progress Notes (Signed)
Dr.    Gerarda Fraction      -     Primary  @   [place],  [place]. Dr.    Tonny Bollman     -     Cardiologist  @ [place].  [facility]  On  [address] [place].  Son    Festus Holts       [phone].

## 2012-01-25 NOTE — Progress Notes (Signed)
This office note has been dictated.  #[id number]

## 2012-01-25 NOTE — Telephone Encounter (Signed)
gv pt/relative  appt schedule for April including mri/pet scan 4/1. Pet scan was already on scheduled and mri was added. Per DM ok to schedule pt for 4/10.

## 2012-01-25 NOTE — Progress Notes (Signed)
CC:   Eber Hong, MD Veverly Fells. Excell Seltzer, MD  PROBLEM BEING ADDRESSED:  Right lung mass in association with right adrenal mass detected on CT chest angiogram on 01/17/2012.  PROBLEM LIST: 1. Right lung mass detected on CT angiogram of the chest carried out     on 01/17/2012 when the patient presented to the emergency room with     hemoptysis. 2. Right adrenal mass measuring 3.3 cm on CT angiogram of chest from     01/17/2012. 3. History of left-sided breast cancer, for which the patient     underwent left modified radical mastectomy in [place], [place], in     1990.  We do not have records.  The tumor was large.  Lymph nodes     were negative.  Following surgery, the patient was placed on     tamoxifen for 5 years. 4. History of right-sided breast cancer which apparently was detected     by mammogram after the patient had stopped taking tamoxifen     somewhere in the mid 90s.  She underwent a right-sided mastectomy,     along with lymph node dissection.  Apparently this was a relatively     small tumor.  Lymph nodes were negative.  Following surgery, the     patient was again on tamoxifen for another 5 years.  We do not have     these records. 5. The patient apparently had a tumor, possibly malignant involving     the skin of the right orbit, status post surgery at [facility] in 2007.  We do not have any records and unfortunately no     other history.  The patient had just surgery, no other treatments     or followup. 6. Coronary artery disease, status post acute myocardial infarction in     early July 2012.  She underwent cardiac catheterization and had 2     stents placed.  She is under the care of Dr. Sharol Harness.  She is     on aspirin and Plavix. 7. Hypertension. 8. Dyslipidemia. 9. Diverticulosis of the colon. 10.History of bleeding ulcers requiring blood transfusions when the     patient was in her early 30s. 11.History of cigarette smoking from 1955 to about  1990, i.e. for     about 35 years.  The patient has stopped smoking. 12.Osteoarthritis involving fingers and hands. 13.Systolic ejection murmur.  HISTORY:  Ms. [name] is a 78 yo white female who has been in generally stable health.  She had no preliminary symptoms prior to March 18th, when during the evening she apparently started having hemoptysis with clots.  She went to the emergency room at [facility], where she had a CT angiogram of the chest.  In essence, there was no evidence for pulmonary emboli.  There was a multilobulated soft tissue mass just below the right hilum, also a 3.3- cm complex mass involving the right adrenal gland.  There was scattered diverticulosis.  The patient is here today with the partner of 1 of her sons.  The patient goes back and forth between her home in [place], [place], and stays with her son and his partner here in [place] often for a week or 2 out of every 4 to 6 weeks.  The patient appears to receive her medical care here in [place], specifically her cardiac care from Dr. Sharol Harness.  She was hospitalized at [facility] when she had her acute myocardial infarction with cardiac cath  and stents placed in early July 2012.  The patient's condition over the past week or so has been stable.  She continues to have slight bloody phlegm.  She is coughing.  That is not a new problem.  In fact, she has really no complaints of shortness of breath, any sense of ill health, new chest pain, or any other new symptomatology other than the hemoptysis and the findings on CT scan as described.  The patient is here for further evaluation and ultimately treatment for this problem.  PAST MEDICAL HISTORY:  Problems can be found as above.  In addition, the patient did have a tonsillectomy as a child.  She has had D and Cs.  She retains her female organs and really denies any other significant surgical or medical history other than what is presented above.  The patient denies  any trauma.  She had blood transfusions when she was having her bleeding ulcers when she was in her 78.  She has had no recurrence.  ALLERGIES:  She is allergic to penicillin, which causes hives.  Also allergic to poison ivy.  CURRENT MEDICINES: 1. Aspirin 81 mg daily. 2. Tessalon Perles 100-mg capsules two 2 to 3 times a day as needed     for cough.  She has been taking this with benefit. 3. Coreg 6.25 mg twice daily with meals. 4. Plavix 75 mg daily. 5. Nitroglycerin as needed.  The patient has not had to take this. 6. Zocor 40 mg at bedtime.  FAMILY HISTORY:  Mother had hypertension and died of stroke at age 59. Father died at age 70, had emphysema.  A brother is alive, lives in [place], has had a heart attack.  There is a half-sister who lives in [place].  She has a pacemaker.  Apparently she recently had an ovarian tumor removed.  This apparently was benign.  The patient has 2 sons, 1 with diabetes mellitus, a heart condition, and possibly autoimmune hemolytic anemia, being treated in [place].  His name is Harvie Heck.  There is another son, Caryn Bee, who is an Landscape architect at [employer].  He lives here in [place] with his partner, Kathlene November, who accompanied the patient today.  Caryn Bee is in good health, perhaps a little overweight.  There is a granddaughter who is in good health and apparently will be married in [place] within the next couple of weeks.  Cousins on the father's side have had lung cancer and breast cancer.  Otherwise no other history of cancer.  SOCIAL HISTORY:  The patient smoked a pack cigarettes a day from [place] to 1990, approximately 35 years.  She has not smoked for over 20 years.  No other use of tobacco products.  No use of alcohol.  The patient was born in [place], [place], which is in [place].  She has a 9th grade education.  She has been a widow since 78, when her husband died at age 50 apparently suddenly just a couple of weeks  after the patient's mastectomy.  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 She denies any exposure to asbestos.  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  She has not worked in 2 years.  The patient does not drive.  As stated above, she goes back and forth between her home in [place], [place], which is 2-1/2 hours from [place] and spends time here in [place] with her son, Caryn Bee and his  partner, Kathlene November.  They live in a townhouse in the [place] area. The patient lives near her brother in [place] and near her half-sister here in [place].  REVIEW OF SYSTEMS:  The patient has headaches.  She had 1 episode where she either lost her balance or had orthostatic hypotension while standing up.  Her blood pressure medicine apparently has been increased recently.  She denies any other neurologic problems, although she has some problems with her balance.  She wears glasses.  No history of glaucoma or cataracts.  Vision is good.  She has decreased hearing in her right ear.  She has sinus problems.  She denies any GI problems other than occasional constipation.  She has had no further problems with her ulcers.  No recent problems with peptic ulcer disease.  No history of abdominal pain or liver problems.  She denies any exertional chest pain or heart failure or arrhythmias.  She saw Dr. Excell Seltzer in December and was doing well and will be seeing him again in 6 months. Respiratory system review is positive for chronic cough, dyspnea on exertion, and the hemoptysis.  No major changes other than the hemoptysis.  The patient has not had recurrence of her breast cancer. She has had bilateral mastectomies.  She has nocturia 4 to 5 times at night.  Otherwise negative.  No GYN problems.  She retains her uterus and ovaries.  No swelling of the legs, blood clots, or intermittent claudication.  She occasionally has nosebleeds, some increased  bleeding and bruising since she started on aspirin and Plavix in July 2012.  She has some chronic discomfort involving her left chest wall following her mastectomy.  No fever or no night sweats.  No rashes or pruritus.  The patient does have a history of depression going back 10 years, is not on anything at present.  I should mention that the patient does walk about a mile every day.  PHYSICAL EXAMINATION:  General:  The patient is a 77 year old white female with weathered features.  Vital Signs:  Her current weight is 144 pounds, height 5 feet 5 inches, body surface area 1.73 sq m.  Blood pressure 175/83.  The patient was made aware of her elevated blood pressure.  Pulse 63 and regular, respirations regular and unlabored. She is afebrile.  HEENT:  No scalp or skull lesions.  Pupillary and extraocular movements are normal.  No scleral icterus.  She has some scarring at the medial aspect of her right orbit.  Cosmetic result is quite excellent.  There are no residual masses or anything to suggest cancer.  Mouth and pharynx are benign.  The patient has no upper teeth. She has an upper plate that she does not use.  Lower teeth are in fair condition.  Neck:  There is what appears to be a lipoma in the right posterior neck.  This has been present for years.  No adenopathy, thyroid enlargement, or bruit.  Lungs:  Clear to percussion and auscultation.  Cardiac Exam:  Regular rhythm with a systolic ejection murmur.  Back:  No skeletal tenderness.  Breasts:  The patient has had bilateral mastectomies with excellent cosmetic result and no evidence for residual cancer on the chest wall.  No axillary or inguinal adenopathy.  Abdomen:  Benign with no organomegaly or masses palpable. Extremities:  No peripheral edema or clubbing.  There may be some slight palmar erythema.  The patient has fairly marked arthritic changes in her fingers and hands.  She has some  bruising, particularly over her right arm  where she had some blood drawn.  Neurologic Exam:  Nonfocal, although the patient seems somewhat unsteady, especially with her feet together and with tandem walking.  She is able to get up from a chair with her arms outstretched.  No obvious cranial nerve deficits.  LABORATORY DATA:  Today white count 5.7, ANC 3.5, hemoglobin 12.3, hematocrit 36.3, platelets 236,000.  Chemistries and CEA are pending. Chemistries from 01/17/2012 notable for BUN of 13, creatinine 0.63. Chemistries were normal.  Estimated GFR was approximately 86%.  On 07/15/2011, albumin was 3.6.  IMAGING STUDIES: 1. Chest x-ray, 2 view, from 01/17/2012 showed a 2.0-cm nodular     density at the left lung base. 2. CT angiogram of the chest on 01/17/2012 showed no evidence for     pulmonary embolism.  There was a multilobulated soft tissue mass     noted at and below the right hilum with extension peripherally     along the right major fissure.  Given this extension along the     fissure, the mass measured approximately 10 cm in length and     approximately 2.9 x 2.3 cm near the right hilum.  The appearance     raised concern for bronchogenic malignancy.  Some of the extensions     appeared to fill and expand the distal bronchials.  The focal     density overlying the left lung base on the prior chest radiograph     was not well characterized on CT and may have reflected     costochondral calcification.  There were scattered calcifications     noted along the descending thoracic aorta.  There were dense     calcifications along the coronary arteries.  Mediastinum was     otherwise unremarkable.  Though the mass arises at the right hilum,     no separate hilar nodes were identified.  Visualized portions of     liver and spleen were unremarkable.  There was a somewhat complex     3.3-cm mass noted in the right adrenal gland.  There was some     increased attenuation in the central focus, but this was felt to be      nonspecific.  Scattered diverticulosis was noted in the colon.  IMPRESSION AND PLAN:  The situation has been discussed with the patient and her son's partner who accompanied her today.  His name was Kathlene November and he is an Pensions consultant.  I have reviewed the chest CT scan from 01/17/2012. Certainly the suspicion for primary lung cancer is high.  There is a possibility that the right adrenal gland may also be involved with tumor.  History is notable for bilateral breast cancer and possibly a malignant tumor involving the medial right orbit, possibly skin cancer or lymphoma.  I have asked the patient to try to obtain prior records. I have given the CT report to the patient.  We have carefully reviewed this. A PET scan was ordered a about a week ago, however, this will not be done until April 1st.  I would also like to go ahead with an MRI of the brain with and without IV contrast.  Certainly our clinical suspicions are high that this is a malignant tumor.  This has been explained to the patient.  As soon as we have some additional information, hopefully we can go ahead with a biopsy.  Ideally if the right adrenal gland is metabolically active, then this may be  the best place, certainly the safest place to try to obtain a biopsy.  If there appear to be no foci aside from the chest, then we will have to decide whether a percutaneous biopsy versus a bronchoscopic biopsy would be optimal.  As stated, we will go ahead with an MRI of the brain with and without IV contrast and a PET scan in the next week or so.  The patient will return for re-evaluation when these scans are completed.  Kathlene November was telling me that he will be in [place] for the next week or so and thus, we will try to see the patient when he returns, which should be somewhere around April 10th.  I have not ordered any labs for that visit.    ______________________________ Samul Dada, M.D. DSM/MEDQ  D:  01/25/2012  T:  01/25/2012  Job:   [id number]

## 2012-01-30 ENCOUNTER — Ambulatory Visit (HOSPITAL_COMMUNITY)
Admission: RE | Admit: 2012-01-30 | Discharge: 2012-01-30 | Disposition: A | Discharge status: Home / Self Care | Payer: Medicare Other | Source: Ambulatory Visit | Attending: Oncology | Admitting: Oncology

## 2012-01-30 ENCOUNTER — Encounter (HOSPITAL_COMMUNITY): Payer: Self-pay

## 2012-01-30 DIAGNOSIS — R918 Other nonspecific abnormal finding of lung field: Secondary | ICD-10-CM

## 2012-01-30 DIAGNOSIS — D381 Neoplasm of uncertain behavior of trachea, bronchus and lung: Secondary | ICD-10-CM | POA: Insufficient documentation

## 2012-01-30 DIAGNOSIS — G319 Degenerative disease of nervous system, unspecified: Secondary | ICD-10-CM | POA: Insufficient documentation

## 2012-01-30 DIAGNOSIS — I6789 Other cerebrovascular disease: Secondary | ICD-10-CM | POA: Insufficient documentation

## 2012-01-30 DIAGNOSIS — J984 Other disorders of lung: Secondary | ICD-10-CM | POA: Insufficient documentation

## 2012-01-30 DIAGNOSIS — R222 Localized swelling, mass and lump, trunk: Secondary | ICD-10-CM | POA: Insufficient documentation

## 2012-01-30 LAB — GLUCOSE, CAPILLARY: Glucose-Capillary: 64 mg/dL — ABNORMAL LOW (ref 70–99)

## 2012-01-30 MED ORDER — FLUDEOXYGLUCOSE F - 18 (FDG) INJECTION
18.3000 | Freq: Once | INTRAVENOUS | Status: AC | PRN
  Administered 2012-01-30: 18.3 via INTRAVENOUS

## 2012-01-30 MED ORDER — GADOBENATE DIMEGLUMINE 529 MG/ML IV SOLN
14.0000 mL | Freq: Once | INTRAVENOUS | Status: AC | PRN
  Administered 2012-01-30: 14 mL via INTRAVENOUS

## 2012-01-31 ENCOUNTER — Inpatient Hospital Stay (HOSPITAL_COMMUNITY): Admission: RE | Admit: 2012-01-31 | Payer: Medicare Other | Source: Ambulatory Visit

## 2012-02-03 ENCOUNTER — Other Ambulatory Visit: Payer: Self-pay

## 2012-02-03 ENCOUNTER — Telehealth: Payer: Self-pay

## 2012-02-03 NOTE — Telephone Encounter (Signed)
lvm at pt and at [facility] phones that Dr Arline Asp has called Dr Shelle Iron and they are to expect Dr Shelle Iron to call them for an appt.

## 2012-02-06 ENCOUNTER — Institutional Professional Consult (permissible substitution): Payer: Medicare Other | Admitting: Pulmonary Disease

## 2012-02-07 ENCOUNTER — Encounter: Payer: Self-pay | Admitting: Pulmonary Disease

## 2012-02-07 ENCOUNTER — Ambulatory Visit (INDEPENDENT_AMBULATORY_CARE_PROVIDER_SITE_OTHER): Payer: Medicare Other | Admitting: Pulmonary Disease

## 2012-02-07 VITALS — BP 142/80 | HR 64 | Temp 97.7°F | Ht 66.0 in | Wt 144.0 lb

## 2012-02-07 DIAGNOSIS — R222 Localized swelling, mass and lump, trunk: Secondary | ICD-10-CM

## 2012-02-07 DIAGNOSIS — R918 Other nonspecific abnormal finding of lung field: Secondary | ICD-10-CM

## 2012-02-07 NOTE — Assessment & Plan Note (Signed)
The patient has a 2.5 cm mass in the right mid lung zone and that a butts the major fissure, and is of unknown origin.  She is at risk with her history of smoking and recent hemoptysis for bronchogenic cancer.  However, the mass in question has very smooth margins and appears atypical for any cancer.  It also had very little uptake on PET scanning.  After long discussion with the patient and her sister, and feel that she needs to have an airway exam with sampling.  She is on aspirin and Plavix for a stent, and I will speak with her cardiologist about stopping this for 48 hours preprocedure.  If I am not able to discontinue the medications, I feel it is safe to do washes and brushes, but not biopsies.

## 2012-02-07 NOTE — Patient Instructions (Addendum)
Will set up for a look into your lungs for next week.  I will speak with Dr. Excell Seltzer about stopping your aspirin and plavix for a few days and let you know.

## 2012-02-07 NOTE — Progress Notes (Signed)
  Subjective:    Patient ID: [name], female    DOB: [birth year], 78 y.o.   MRN: [id number]  HPI The patient is a 78 year old female who been asked to see for an abnormal CT chest.  She recently had an episode of hemoptysis, that was initially dark in color, and then bright red.  She went to the emergency room where a chest x-ray followed by a CT chest showed a 2.5 cm density in her right mid lung zone that abutted the major fissure.  There was no significant lymphadenopathy.  She then underwent a PET scan under the direction of oncology, that showed minimal increased uptake in the mass in question along its periphery.  There was no other abnormal uptake noted.  The patient denies any right-sided chest pain, and has had a good appetite.  She has lost 5 pounds recently.  She does have a long history of smoking, but has not done so since 1990.  She does have a history of breast cancer in the distant past without obvious recurrence.  She also has a history of coronary disease with stenting in the past.   Review of Systems  Constitutional: Positive for unexpected weight change. Negative for fever.  HENT: Positive for sore throat and sneezing. Negative for ear pain, nosebleeds, congestion, rhinorrhea, trouble swallowing, dental problem, postnasal drip and sinus pressure.   Eyes: Negative for redness and itching.  Respiratory: Positive for cough and shortness of breath. Negative for chest tightness and wheezing.   Cardiovascular: Negative for palpitations and leg swelling.  Gastrointestinal: Negative for nausea and vomiting.  Genitourinary: Negative for dysuria.  Musculoskeletal: Negative for joint swelling.  Skin: Negative for rash.  Neurological: Positive for headaches.  Hematological: Does not bruise/bleed easily.  Psychiatric/Behavioral: Positive for dysphoric mood. The patient is nervous/anxious.        Objective:   Physical Exam Constitutional:  Thin female, no acute distress  HENT:   Nares patent without discharge, but narrowed bilat.  Oropharynx without exudate, palate and uvula are normal  Eyes:  Perrla, eomi, no scleral icterus  Neck:  No JVD, no TMG  Cardiovascular:  Normal rate, regular rhythm, no rubs or gallops.  No murmurs        Intact distal pulses  Pulmonary :  Normal breath sounds, no stridor or respiratory distress   No rales, rhonchi, or wheezing  Abdominal:  Soft, nondistended, bowel sounds present.  No tenderness noted.   Musculoskeletal:  No lower extremity edema noted.  Lymph Nodes:  No cervical lymphadenopathy noted  Skin:  No cyanosis noted  Neurologic:  Alert, appropriate, moves all 4 extremities without obvious deficit.         Assessment & Plan:

## 2012-02-08 ENCOUNTER — Telehealth: Payer: Self-pay | Admitting: Oncology

## 2012-02-08 ENCOUNTER — Ambulatory Visit (HOSPITAL_BASED_OUTPATIENT_CLINIC_OR_DEPARTMENT_OTHER): Payer: Medicare Other | Admitting: Oncology

## 2012-02-08 ENCOUNTER — Encounter: Payer: Self-pay | Admitting: Oncology

## 2012-02-08 VITALS — BP 149/80 | HR 69 | Temp 97.9°F | Ht 66.0 in | Wt 143.4 lb

## 2012-02-08 DIAGNOSIS — R222 Localized swelling, mass and lump, trunk: Secondary | ICD-10-CM

## 2012-02-08 DIAGNOSIS — R918 Other nonspecific abnormal finding of lung field: Secondary | ICD-10-CM

## 2012-02-08 NOTE — Telephone Encounter (Signed)
appt made and printed for pt aom °

## 2012-02-08 NOTE — Progress Notes (Signed)
This office note has been dictated.  #[id number]

## 2012-02-09 NOTE — Progress Notes (Signed)
CC:   Veverly Fells. Excell Seltzer, MD Eber Hong, MD Barbaraann Share, MD,FCCP  PROBLEM LIST:  1. Right lung mass detected on CT angiogram of the chest carried out  on 01/17/2012 when the patient presented to the emergency room with  hemoptysis.  2. Right adrenal mass measuring 3.3 cm on CT angiogram of chest from  01/17/2012. No significant activity on PET scan done 01/30/12. 3. History of left-sided breast cancer, for which the patient  underwent left modified radical mastectomy in [place], [place], in  1990. We do not have records. The tumor was large. Lymph nodes  were negative. Following surgery, the patient was placed on  tamoxifen for 5 years.  4. History of right-sided breast cancer which apparently was detected  by mammogram after the patient had stopped taking tamoxifen  somewhere in the mid 90s. She underwent a right-sided mastectomy,  along with lymph node dissection. Apparently this was a relatively  small tumor. Lymph nodes were negative. Following surgery, the  patient was again on tamoxifen for another 5 years. We do not have  these records.  5. The patient apparently had a tumor, possibly malignant involving  the skin of the right orbit, status post surgery at [facility] in 2007. We do not have any records and unfortunately no  other history. The patient had just surgery, no other treatments  or followup.  6. Coronary artery disease, status post acute myocardial infarction in  early July 2012. She underwent cardiac catheterization and had 2  stents placed. She is under the care of Dr. Sharol Harness. She is  on aspirin and Plavix.  7. Hypertension.  8. Dyslipidemia.  9. Diverticulosis of the colon.  10.History of bleeding ulcers requiring blood transfusions when the  patient was in her early 30s.  11.History of cigarette smoking from 1955 to about 1990, i.e. for  about 35 years. The patient has stopped smoking.  12.Osteoarthritis involving fingers and hands.    13.Systolic ejection murmur.  CURRENT MEDICINES:  1. Aspirin 81 mg daily.  2. Tessalon Perles 100-mg capsules two 2 to 3 times a day as needed  for cough. She has been taking this with benefit.  3. Coreg 6.25 mg twice daily with meals.  4. Plavix 75 mg daily.  5. Nitroglycerin as needed. The patient has not had to take this.  6. Zocor 40 mg at bedtime.   HISTORY:  I am seeing Maddux First today for followup of an indeterminate right lung mass detected on a CT angiogram on 01/17/2012 when the patient presented to the [facility] room with hemoptysis.  She was seen by me at the [facility] for an initial consultation on 01/25/2012.  We were concerned that the patient had lung cancer on the basis of the CT scan, and the fact that there was also enlargement of the right adrenal gland.  The patient is here today with her sister, Okey Regal, and son, Caryn Bee.  Mrs. Mcfate has undergone an MRI of the brain with and without IV contrast on 01/30/2012 that showed no evidence for metastatic disease. Also on that same date 01/30/2012, the patient underwent a PET scan, which confirms the presence of the right middle lobe mass.  There was no increased uptake in the adrenal gland.  The appearance was felt to be indeterminate between a benign and malignant lesion.  The bronchoscopy was suggested.  Mrs. Wacha has been seen in consultation by Dr. Marcelyn Bruins on 02/07/2012, which was yesterday.  He is  planning to do a fiberoptic bronchoscopy on Tuesday, April 16th.  He is going to consult with Dr. Excell Seltzer regarding the aspirin and Plavix, which the patient is on for a coronary artery stent.  He is hoping that he will be able to discontinue these medicines so that he can do biopsies.  Apparently he feels that a biopsy would be risky if these medicines cannot be discontinued.  Mrs. Oblinger reports no major changes in her condition.  She has apparently been having some epistaxis from  her left nostril.  She denies any further hemoptysis.  There is no other new symptomatology and her condition remains essentially unchanged.  PHYSICAL EXAMINATION:  There is no obvious change.  Weight is 143.4 pounds.  Height 5 feet 6 inches. Body surface area 1.74 sq m. Blood pressure 149/80.  Other vital signs are normal.  Physical exam remains unchanged.  The patient still has a soft systolic ejection murmur. Lungs:  Clear.  There is no adenopathy.  Abdomen:  Benign.  Extremities: No peripheral edema.  IMAGING STUDIES:  1. Chest x-ray, 2 view, from 01/17/2012 showed a 2.0-cm nodular  density at the left lung base.  2. CT angiogram of the chest on 01/17/2012 showed no evidence for  pulmonary embolism. There was a multilobulated soft tissue mass  noted at and below the right hilum with extension peripherally  along the right major fissure. Given this extension along the  fissure, the mass measured approximately 10 cm in length and  approximately 2.9 x 2.3 cm near the right hilum. The appearance  raised concern for bronchogenic malignancy. Some of the extensions  appeared to fill and expand the distal bronchials. The focal  density overlying the left lung base on the prior chest radiograph  was not well characterized on CT and may have reflected  costochondral calcification. There were scattered calcifications  noted along the descending thoracic aorta. There were dense  calcifications along the coronary arteries. Mediastinum was  otherwise unremarkable. Though the mass arises at the right hilum,  no separate hilar nodes were identified. Visualized portions of  liver and spleen were unremarkable. There was a somewhat complex  3.3-cm mass noted in the right adrenal gland. There was some  increased attenuation in the central focus, but this was felt to be  nonspecific. Scattered diverticulosis was noted in the colon. 3. MRI of the head with and without IV contrast on 01/30/2012  showed     atrophy and small vessel disease.  There was no obvious metastatic     disease, and no abnormal intracranial enhancement. 4. PET scan on 01/30/2012 showed branching opacity which could reflect     soft tissue/mucus plugging within a segmental right middle lobe     bronchus.  Centrally there was a 2.9 x 2.5 cm nodular opacity with     associated mild hypermetabolism along its lateral/peripheral     margin, maximum SUV 4.3.  There were no hypermetabolic mediastinal     or hilar nodes.  There was no abnormal hypermetabolic activity     within the liver,  pancreas, adrenal glands or spleen.  No     hypermetabolic lymph nodes in the abdomen or pelvis.  No focal     hypermetabolic activity within the skeletal system.  Conclusion was     that the appearance of the scan was indeterminate.  Could reflect a     benign lesion with postobstructive infection/inflammation; however,     tumor could not be excluded.  Bronchoscopy with tissue sampling was     suggested.  IMPRESSION AND PLAN:  I have provided the reports of the imaging study to the patient and family.  We have discussed the results, which are indeterminate.  The patient is scheduled for fiberoptic bronchoscopy with Dr. Shelle Iron on Tuesday, April 16th.  If the results of that study are indeterminate, then the patient may be a candidate for either a percutaneous core needle biopsy by Interventional Radiology, or if that is not possible then consideration for surgical resection.  I have asked Mrs. Greggs to return in 2 weeks.  I have not ordered any labs for that visit.    ______________________________ Samul Dada, M.D. DSM/MEDQ  D:  02/08/2012  T:  02/09/2012  Job:  [id number]

## 2012-02-10 ENCOUNTER — Telehealth: Payer: Self-pay | Admitting: Cardiovascular Disease

## 2012-02-10 ENCOUNTER — Telehealth: Payer: Self-pay | Admitting: *Deleted

## 2012-02-10 NOTE — Telephone Encounter (Signed)
Message copied by Tonny Bollman on Fri Feb 10, 2012  3:18 PM ------      Message from: [place], Maree Krabbe      Created: Fri Feb 10, 2012 10:44 AM       Casimiro Needle, need to do bronch on this pt next week for lung mass.  Can I stop her plavix/asa for a few days?  If not, will have to do brush and wash but no biopsy.  Let me know.

## 2012-02-10 NOTE — Telephone Encounter (Signed)
Dr. Shelle Iron spoke with Dr. Excell Seltzer regarding stopping pt's asa and plavix prior to bronch.  Per KC, ok to stop asa and plavix from Saturday 02/11/2012 through Tuesday 02/14/2012.  Called and spoke with pt. Informed her of the above information.  Pt verbalized understanding and will stop asa and plavix this Saturday through Tuesday.  Pt denied any questions.

## 2012-02-10 NOTE — Telephone Encounter (Signed)
I spoke with Dr. Shelle Iron about the patient. She needs bronchoscopy with biopsy for a newly diagnosed lung mass. The patient had 2 drug-eluting stents last summer. She is greater than 6 months out from stent placement but still within a 12 month recommended guideline for dual antiplatelet therapy. Considering the big picture and high likelihood that she has lung cancer, I think it makes the most sense to proceed with her biopsy and her aspirin and Plavix will need to be held for 3-4 days. In my opinion this is acceptable and is the best way to proceed. She should be started back on aspirin and Plavix as soon as possible after the procedure.

## 2012-02-13 ENCOUNTER — Encounter (HOSPITAL_COMMUNITY): Payer: Self-pay

## 2012-02-14 ENCOUNTER — Encounter (HOSPITAL_COMMUNITY): Payer: Self-pay | Admitting: Respiratory Therapy

## 2012-02-14 ENCOUNTER — Ambulatory Visit (HOSPITAL_COMMUNITY)
Admission: RE | Admit: 2012-02-14 | Discharge: 2012-02-14 | Disposition: A | Discharge status: Home / Self Care | Payer: Medicare Other | Source: Ambulatory Visit | Attending: Pulmonary Disease | Admitting: Pulmonary Disease

## 2012-02-14 ENCOUNTER — Encounter (HOSPITAL_COMMUNITY)
Admission: RE | Disposition: A | Discharge status: Home / Self Care | Payer: Self-pay | Source: Ambulatory Visit | Attending: Pulmonary Disease

## 2012-02-14 DIAGNOSIS — R05 Cough: Secondary | ICD-10-CM | POA: Insufficient documentation

## 2012-02-14 DIAGNOSIS — D3A090 Benign carcinoid tumor of the bronchus and lung: Secondary | ICD-10-CM | POA: Insufficient documentation

## 2012-02-14 DIAGNOSIS — R51 Headache: Secondary | ICD-10-CM | POA: Insufficient documentation

## 2012-02-14 DIAGNOSIS — R059 Cough, unspecified: Secondary | ICD-10-CM | POA: Insufficient documentation

## 2012-02-14 DIAGNOSIS — R222 Localized swelling, mass and lump, trunk: Secondary | ICD-10-CM | POA: Insufficient documentation

## 2012-02-14 DIAGNOSIS — R6889 Other general symptoms and signs: Secondary | ICD-10-CM | POA: Insufficient documentation

## 2012-02-14 HISTORY — PX: VIDEO BRONCHOSCOPY: SHX5072

## 2012-02-14 SURGERY — BRONCHOSCOPY, WITH FLUOROSCOPY
Anesthesia: Moderate Sedation | Laterality: Bilateral

## 2012-02-14 MED ORDER — MEPERIDINE HCL 25 MG/ML IJ SOLN
INTRAMUSCULAR | Status: DC | PRN
  Administered 2012-02-14: 50 mg via INTRAVENOUS

## 2012-02-14 MED ORDER — LIDOCAINE HCL 1 % IJ SOLN
INTRAMUSCULAR | Status: DC | PRN
  Administered 2012-02-14: 6 mL via RESPIRATORY_TRACT

## 2012-02-14 MED ORDER — SODIUM CHLORIDE 0.9 % IV SOLN: INTRAVENOUS | Status: DC

## 2012-02-14 MED ORDER — MEPERIDINE HCL 100 MG/ML IJ SOLN
INTRAMUSCULAR | Status: AC
  Filled 2012-02-14: qty 2

## 2012-02-14 MED ORDER — MIDAZOLAM HCL 10 MG/2ML IJ SOLN
INTRAMUSCULAR | Status: DC | PRN
  Administered 2012-02-14: 5 mg via INTRAVENOUS

## 2012-02-14 MED ORDER — MIDAZOLAM HCL 10 MG/2ML IJ SOLN
INTRAMUSCULAR | Status: AC
  Filled 2012-02-14: qty 4

## 2012-02-14 MED ORDER — LIDOCAINE HCL 2 % EX GEL
CUTANEOUS | Status: DC | PRN
  Administered 2012-02-14: 1

## 2012-02-14 MED ORDER — PHENYLEPHRINE HCL 0.25 % NA SOLN
NASAL | Status: DC | PRN
  Administered 2012-02-14: 2

## 2012-02-14 NOTE — H&P (View-Only) (Signed)
  Subjective:    Patient ID: [name], female    DOB: 12/06/1933, 77 y.o.   MRN: [id number]  HPI The patient is a 77-year-old female who been asked to see for an abnormal CT chest.  She recently had an episode of hemoptysis, that was initially dark in color, and then bright red.  She went to the emergency room where a chest x-ray followed by a CT chest showed a 2.5 cm density in her right mid lung zone that abutted the major fissure.  There was no significant lymphadenopathy.  She then underwent a PET scan under the direction of oncology, that showed minimal increased uptake in the mass in question along its periphery.  There was no other abnormal uptake noted.  The patient denies any right-sided chest pain, and has had a good appetite.  She has lost 5 pounds recently.  She does have a long history of smoking, but has not done so since 1990.  She does have a history of breast cancer in the distant past without obvious recurrence.  She also has a history of coronary disease with stenting in the past.   Review of Systems  Constitutional: Positive for unexpected weight change. Negative for fever.  HENT: Positive for sore throat and sneezing. Negative for ear pain, nosebleeds, congestion, rhinorrhea, trouble swallowing, dental problem, postnasal drip and sinus pressure.   Eyes: Negative for redness and itching.  Respiratory: Positive for cough and shortness of breath. Negative for chest tightness and wheezing.   Cardiovascular: Negative for palpitations and leg swelling.  Gastrointestinal: Negative for nausea and vomiting.  Genitourinary: Negative for dysuria.  Musculoskeletal: Negative for joint swelling.  Skin: Negative for rash.  Neurological: Positive for headaches.  Hematological: Does not bruise/bleed easily.  Psychiatric/Behavioral: Positive for dysphoric mood. The patient is nervous/anxious.        Objective:   Physical Exam Constitutional:  Thin female, no acute distress  HENT:   Nares patent without discharge, but narrowed bilat.  Oropharynx without exudate, palate and uvula are normal  Eyes:  Perrla, eomi, no scleral icterus  Neck:  No JVD, no TMG  Cardiovascular:  Normal rate, regular rhythm, no rubs or gallops.  No murmurs        Intact distal pulses  Pulmonary :  Normal breath sounds, no stridor or respiratory distress   No rales, rhonchi, or wheezing  Abdominal:  Soft, nondistended, bowel sounds present.  No tenderness noted.   Musculoskeletal:  No lower extremity edema noted.  Lymph Nodes:  No cervical lymphadenopathy noted  Skin:  No cyanosis noted  Neurologic:  Alert, appropriate, moves all 4 extremities without obvious deficit.         Assessment & Plan:   

## 2012-02-14 NOTE — Progress Notes (Signed)
Video Bronchoscopy Procedure Done:  Bronchoscopy  Procedure  Done with samples taken: Washing Intervention Biopsy Intervention Brushing Intervention

## 2012-02-14 NOTE — Op Note (Signed)
Dictation # [id number]

## 2012-02-14 NOTE — Interval H&P Note (Signed)
History and Physical Interval Note:  02/14/2012 8:23 AM  [name]  has presented today for surgery, with the diagnosis of lung mass  The various methods of treatment have been discussed with the patient and family. After consideration of risks, benefits and other options for treatment, the patient has consented to  Procedure(s) (LRB): VIDEO BRONCHOSCOPY WITH FLUORO (Bilateral) as a surgical intervention .  The patients' history has been reviewed, patient examined, no change in status, stable for surgery.  I have reviewed the patients' chart and labs.  Questions were answered to the patient's satisfaction.     Barbaraann Share

## 2012-02-14 NOTE — Discharge Instructions (Addendum)
Restart plavix and aspirin in am.    Bronchoscopy Care After These instructions give you information on caring for yourself after your procedure. Your doctor may also give you specific instructions. Call your doctor if you have any problems or questions after your procedure. HOME CARE  Do not eat or drink anything for 2 hours after your test. Your nose and throat was numbed by medicine. If you try to eat or drink before the medicine wears off, food or drink could go into your lungs.   For the rest of the first day, eat soft food and drink liquids slowly.   On the day after the test, you can go back to eating your usual food.   Do not drive or sign important papers the day of the test.   Take it easy for the next 2 days. Do not do any heavy work, exercise, or activities.   Only take medicine as told by your doctor. Do not take aspirin.   You may be drowsy for the next 24 hours.   You may see traces of blood in your spit for 1 to 2 days.  Finding out the results of your test Ask when your test results will be ready. Make sure you get your test results. GET HELP RIGHT AWAY IF:  You have breathing problems.   You have a bad sore throat for more than 1 week.   You see traces of blood in your spit for more than 3 days.   You start coughing up blood.   You have a temperature of 102 F (38.9 C) or higher.  MAKE SURE YOU:  Understand these instructions.   Will watch your condition.   Will get help right away if you are not doing well or get worse.  Document Released: 08/14/2009 Document Revised: 10/06/2011 Document Reviewed: 08/14/2009 [employer] Patient Information 2012 [place], [place].

## 2012-02-15 ENCOUNTER — Encounter (HOSPITAL_COMMUNITY): Payer: Self-pay | Admitting: Pulmonary Disease

## 2012-02-15 ENCOUNTER — Encounter (HOSPITAL_COMMUNITY): Payer: Self-pay

## 2012-02-15 NOTE — Op Note (Signed)
NAMEMARISELA, LINE NO.:  [id number]  MEDICAL RECORD NO.:  [id number]  LOCATION:                               FACILITY:  [facility]  PHYSICIAN:  Barbaraann Share, MD,FCCPDATE OF BIRTH:  Mar 18, 1934  DATE OF PROCEDURE:  02/14/2012 DATE OF DISCHARGE:                              OPERATIVE REPORT   PROCEDURE:  Video flexible fiberoptic bronchoscopy with biopsy.  OPERATOR:  Barbaraann Share, MD,FCCP  ANESTHESIA:  Versed 5 mg IV, Demerol 50 mg IV, and topical 1% lidocaine in the vocal cords and airways during the procedure.  INDICATION:  Right middle lobe mass of unknown origin.  DESCRIPTION OF PROCEDURE:  After obtaining informed consent and under close cardiopulmonary monitoring, the above preop anesthesia was given and fiberoptic scope was passed to the right naris and into posterior pharynx, where there were no lesions or other abnormalities seen.  The vocal cords appeared to be within normal limits and moved bilaterally on phonation.  Scope was then passed into the trachea where it was examined along its entire length down to the level of the carina, all of which was normal.  The left tracheobronchial tree was examined at the subsegmental level with no endobronchial abnormality being found.  The right tracheobronchial tree was examined serially with a normal right lower lobe and right upper lobe.  Attention was then paid to the right middle lobe where there was a well-rounded mass noted in the medial segment of the right middle lobe.  The lateral segment was totally normal and free of disease.  The lesion in question easily bled when touched by the scope or other catheters.  Bronchial washes were done from the medial segment of the right middle lobe, followed by bronchial brushes.  There was mild bleeding noted after the brushes, but easily controlled.  Unfortunately, this made biopsies difficult by obscuring the field.  Endobronchial pinch biopsies were then  done of the lesion in question with good material being obtained.  Good hemostasis was maintained throughout the whole procedure, and the patient remained hemodynamically stable.  She had excellent oxygen saturations throughout.  The specimens will be sent for the usual cytologic and microbiology evaluation.  Overall, she tolerated the procedure well and there were no immediate complications.     Barbaraann Share, MD,FCCP     [facility]/MEDQ  D:  02/14/2012  T:  02/15/2012  Job:  [id number]  cc:   Samul Dada, M.D. Fax: [phone]

## 2012-02-16 DIAGNOSIS — R222 Localized swelling, mass and lump, trunk: Secondary | ICD-10-CM

## 2012-02-17 ENCOUNTER — Ambulatory Visit (INDEPENDENT_AMBULATORY_CARE_PROVIDER_SITE_OTHER): Payer: Medicare Other | Admitting: Pulmonary Disease

## 2012-02-17 ENCOUNTER — Encounter: Payer: Self-pay | Admitting: Pulmonary Disease

## 2012-02-17 VITALS — BP 132/84 | HR 65 | Temp 98.3°F | Ht 66.0 in | Wt 144.2 lb

## 2012-02-17 DIAGNOSIS — R918 Other nonspecific abnormal finding of lung field: Secondary | ICD-10-CM

## 2012-02-17 DIAGNOSIS — R222 Localized swelling, mass and lump, trunk: Secondary | ICD-10-CM

## 2012-02-17 NOTE — Patient Instructions (Signed)
Will set up for breathing tests, and will call you with results. Will refer to a chest surgeon to consider taking out your growth. followup with me as needed.

## 2012-02-17 NOTE — Assessment & Plan Note (Signed)
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  She will need to have a surgical resection if at all possible, and we'll therefore schedule her for full pulmonary function studies for preop clearance.  She will also need at least verbal clearance from her cardiologist, but I suspect this is not an issue given her fresh stents.  Will schedule for pulmonary function studies, and also get an appointment to see a thoracic surgeon.  I will let her know the results of her breathing studies.

## 2012-02-17 NOTE — Progress Notes (Signed)
  Subjective:    Patient ID: [name], female    DOB: 08/02/1934, 78 y.o.   MRN: [id number]  HPI The patient comes in today for followup after her recent bronchoscopy.  She was found to have a visible mass in the medial segment of her right middle lobe, and biopsies were positive for carcinoid.  I have reviewed the results with her and her family in detail, and answered all of her questions.   Review of Systems  Constitutional: Negative for fever and unexpected weight change.  HENT: Positive for sore throat, rhinorrhea and sneezing. Negative for ear pain, nosebleeds, congestion, trouble swallowing, dental problem, postnasal drip and sinus pressure.   Eyes: Negative for redness and itching.  Respiratory: Positive for cough, chest tightness, shortness of breath and wheezing.   Cardiovascular: Negative for palpitations and leg swelling.  Gastrointestinal: Positive for nausea. Negative for vomiting.  Genitourinary: Negative for dysuria.  Musculoskeletal: Negative for joint swelling.  Skin: Negative for rash.  Neurological: Positive for headaches.  Hematological: Bruises/bleeds easily.  Psychiatric/Behavioral: Negative for dysphoric mood. The patient is not nervous/anxious.        Objective:   Physical Exam Wd female in nad Nose without purulence or discharge LE without significant edema or cyanosis Alert and oriented, moves all 4.        Assessment & Plan:

## 2012-02-21 ENCOUNTER — Institutional Professional Consult (permissible substitution) (INDEPENDENT_AMBULATORY_CARE_PROVIDER_SITE_OTHER): Payer: Medicare Other | Admitting: Surgery

## 2012-02-21 ENCOUNTER — Encounter: Payer: Self-pay | Admitting: Surgery

## 2012-02-21 ENCOUNTER — Telehealth: Payer: Self-pay | Admitting: Medical Oncology

## 2012-02-21 ENCOUNTER — Ambulatory Visit (INDEPENDENT_AMBULATORY_CARE_PROVIDER_SITE_OTHER): Payer: Medicare Other | Admitting: Pulmonary Disease

## 2012-02-21 VITALS — BP 161/79 | HR 69 | Resp 16 | Ht 66.0 in | Wt 144.0 lb

## 2012-02-21 DIAGNOSIS — D381 Neoplasm of uncertain behavior of trachea, bronchus and lung: Secondary | ICD-10-CM

## 2012-02-21 DIAGNOSIS — C3490 Malignant neoplasm of unspecified part of unspecified bronchus or lung: Secondary | ICD-10-CM

## 2012-02-21 LAB — PULMONARY FUNCTION TEST

## 2012-02-21 NOTE — Progress Notes (Signed)
[address]            [name] [address]          [phone]      PCP is Gerarda Fraction, MD, MD Referring Provider is Clance, Maree Krabbe, MD  Chief Complaint  Patient presents with  . Lung Cancer    eval and treat....Dr. Tonny Bollman...cardiologist    HPI:  The patient is a 78 year old previous smoker who presented to the emergency room earlier this month with an episode of hemoptysis that was initially dark in color and then bright red. She had an abnormal chest x-ray showing a right hilar lung mass and CT scan showed a 2.9 cm multilobulated soft tissue mass  below the right hilum with extension peripherally along the right major fissure. CT scan also showed a 3.3 cm mass in the right adrenal gland with a central focus of increased attenuation that was nonspecific in appearance. A PET scan showed mild hypermetabolism along the lateral margin of this lesion with a maximum SUV of 4.3. There were no hypermetabolic mediastinal or hilar lymph nodes. There was no other significant hypermetabolic uptake. The patient subsequently underwent bronchoscopy by Dr.Clance showing a visible mass in the medial segment of the right middle lobe bronchus with biopsies positive for carcinoid.   Past Medical History  Diagnosis Date  . Hypertension   . Dyslipidemia   . Breast cancer   . Cancer of eye   . Myocardial infarct   . Lung mass   . Stroke     Past Surgical History  Procedure Date  . Mastectomy 1990    Bilateral  . Eye surgery   . Video bronchoscopy 02/14/2012    Procedure: VIDEO BRONCHOSCOPY WITH FLUORO;  Surgeon: Barbaraann Share, MD;  Location: [facility];  Service: Cardiopulmonary;  Laterality: Bilateral;    Family History  Problem Relation Age of Onset  . Coronary artery disease Neg Hx     Premature  . Emphysema Father   . Heart disease Brother   . Heart disease Son     Social History History  Substance Use Topics  . Smoking status: Former  Smoker -- 1.0 packs/day for 25 years    Types: Cigarettes    Quit date: 10/31/1988  . Smokeless tobacco: Never Used  . Alcohol Use: No    Current Outpatient Prescriptions  Medication Sig Dispense Refill  . aspirin 81 MG tablet Take 81 mg by mouth daily.       . benzonatate (TESSALON) 100 MG capsule Take 100 mg by mouth 3 (three) times daily as needed.      . carvedilol (COREG) 6.25 MG tablet Take 1 tablet (6.25 mg total) by mouth 2 (two) times daily with a meal.  90 tablet  3  . clopidogrel (PLAVIX) 75 MG tablet Take 1 tablet (75 mg total) by mouth daily.  90 tablet  3  . nitroGLYCERIN (NITROSTAT) 0.4 MG SL tablet Place 0.4 mg under the tongue every 5 (five) minutes as needed. For chest pains up to 3 doses. If pain still persist call emergency personal.       . simvastatin (ZOCOR) 40 MG tablet Take 1 tablet (40 mg total) by mouth at bedtime.  90 tablet  3    Allergies  Allergen Reactions  . Penicillins Hives    Review of Systems  Constitutional: Positive for unexpected weight change. Negative  for fever, chills, appetite change and fatigue.  Eyes: Negative.   Respiratory: Positive for cough and shortness of breath.        Hemoptysis  Cardiovascular: Negative for chest pain and leg swelling.  Gastrointestinal: Positive for constipation.  Genitourinary: Positive for frequency. Negative for dysuria.  Musculoskeletal: Positive for arthralgias.  Skin: Negative.   Neurological: Positive for headaches.  Hematological: Negative.   Psychiatric/Behavioral: Negative.     BP 161/79  Pulse 69  Resp 16  Ht 5\' 6"  (1.676 m)  Wt 144 lb (65.318 kg)  BMI 23.24 kg/m2  SpO2 95% Physical Exam  Constitutional: She is oriented to person, place, and time. She appears well-developed and well-nourished.  HENT:  Head: Normocephalic and atraumatic.  Mouth/Throat: Oropharynx is clear and moist.  Eyes: Conjunctivae and EOM are normal. Pupils are equal, round, and reactive to light. No scleral  icterus.  Neck: Normal range of motion. Neck supple. No JVD present. No tracheal deviation present. No thyromegaly present.  Cardiovascular: Normal rate, regular rhythm, normal heart sounds and intact distal pulses.  Exam reveals no gallop and no friction rub.   No murmur heard. Pulmonary/Chest: Effort normal and breath sounds normal. No respiratory distress. She has no rales.  Abdominal: Soft. Bowel sounds are normal. She exhibits no distension and no mass. There is no tenderness.  Musculoskeletal: She exhibits edema.  Lymphadenopathy:    She has no cervical adenopathy.  Neurological: She is alert and oriented to person, place, and time. She has normal strength. No cranial nerve deficit or sensory deficit.  Skin: Skin is warm and dry.  Psychiatric: She has a normal mood and affect.     Diagnostic Tests:  MRI HEAD WITHOUT AND WITH CONTRAST   Technique:  Multiplanar, multiecho pulse sequences of the brain and surrounding structures were obtained according to standard protocol without and with intravenous contrast   Contrast: 14mL MULTIHANCE GADOBENATE DIMEGLUMINE 529 MG/ML IV SOLN   Comparison: None.   Findings: There is no evidence for acute infarction, intracranial hemorrhage, mass lesion, hydrocephalus, or extra-axial fluid. Moderate atrophy, with extensive chronic microvascular ischemic change.  No midline shift.  Major intracranial vascular structures patent.  Post infusion, no abnormal intracranial enhancement.  Mild chronic sinus disease.  No mastoid fluid. Pituitary, cerebellar tonsils, and upper cervical region all appear unremarkable.  No visible skull base or calvarial lesions.   IMPRESSION:  Atrophy and small vessel disease. No visible metastatic disease.  No abnormal intracranial enhancement.   Original Report Authenticated By: Elsie Stain, M.D. CT ANGIOGRAPHY CHEST   Technique:  Multidetector CT imaging of the chest using the standard protocol during bolus  administration of intravenous contrast. Multiplanar reconstructed images including MIPs were obtained and reviewed to evaluate the vascular anatomy.   Contrast: 80mL OMNIPAQUE IOHEXOL 350 MG/ML IV SOLN   Comparison: Chest radiograph performed earlier today at 03:51 a.m.   Findings: There is no evidence of pulmonary embolus.   There is a multilobulated soft tissue mass noted at and below the right hilum, with extension peripherally along the right major fissure.  Given extension along the fissure, the mass measures approximately 10 cm in length, and approximately 2.9 x 2.3 cm near the right hilum.   The appearance raises concern for bronchogenic malignancy.  Some of the extensions appear to fill and expand distal bronchioles.  No additional masses are identified.  The focal density overlying the left lung base on prior chest radiograph is not well characterized on CT, but may have  reflected costochondral calcification.   Mild bibasilar atelectasis is noted; minimal nodularity at the lung bases likely reflects atelectasis.  There is no evidence of pleural effusion or pneumothorax.   Scattered calcification is noted along the descending thoracic aorta.  Dense calcification is noted along the coronary arteries. The mediastinum is otherwise unremarkable in appearance.  Scattered mediastinal nodes remain borderline normal in size; no mediastinal lymphadenopathy is seen.  Though the mass arises at the right hilum, no separate hilar nodes are identified.   No pericardial effusion is identified.  The great vessels are grossly unremarkable in appearance.  No axillary lymphadenopathy is seen.  The thyroid gland is unremarkable in appearance.  Scattered clips are noted at both axilla.   The visualized portions of the liver and spleen are unremarkable. A somewhat complex 3.3 cm mass is noted at the right adrenal gland. This contains a central focus of increased attenuation, and  is nonspecific in appearance.  Scattered diverticulosis is noted along the visualized portions of the colon.   No acute osseous abnormalities are seen.   IMPRESSION:   1.  No evidence of pulmonary embolus. 2.  Multilobulated soft tissue mass at and below the right hilum, extending peripherally along the right major fissure.  Given extension along the fissure, the mass measures approximately 10 cm in length, and 2.9 x 2.3 cm near the right hilum.  This is concerning for bronchogenic malignancy; the mass appears to fill and expand distal bronchioles. 3.  Mild bibasilar atelectasis noted. 4.  Dense calcification along the coronary arteries. 5.  Somewhat complex 3.3 cm mass of the right adrenal gland, with a central focus of increased attenuation; this is nonspecific in appearance.  Metastatic disease cannot be entirely excluded. 6.  Scattered diverticulosis along the visualized portions of the colon.   These results were called by telephone on 01/17/2012  at  07:02 a.m. to  Dr. Eber Hong, who verbally acknowledged these results.   Original Report Authenticated By: Tonia Ghent, M.D.      NUCLEAR MEDICINE PET SKULL BASE TO THIGH   Fasting Blood Glucose:  64   Technique:  18.3 mCi F-18 FDG was injected intravenously. CT data was obtained and used for attenuation correction and anatomic localization only.  (This was not acquired as a diagnostic CT examination.) Additional exam technical data entered on technologist worksheet.   Comparison:  CT chest dated 01/17/2012   Findings:   Neck: No hypermetabolic lymph nodes in the neck.   Chest:  Branching opacity may reflect soft tissue/mucus plugging within a segmental right middle lobe bronchus.   Centrally, there is a 2.9 x 2.5 cm nodular opacity with associated mild hypermetabolism along its lateral/peripheral margin, max SUV 4.3. No hypermetabolic mediastinal or hilar nodes.   Abdomen/Pelvis:  No abnormal hypermetabolic  activity within the liver, pancreas, adrenal glands, or spleen.  No hypermetabolic lymph nodes in the abdomen or pelvis.   [place]:  No focal hypermetabolic activity to suggest skeletal metastasis.   IMPRESSION: Branching soft tissue opacity along  a segmental right middle lobe bronchus, with central 2.9 x 2.5 cm nodular opacity.  Associated mild hypermetabolism along its lateral/peripheral margin, max SUV 4.3.   This appearance is indeterminate, and could still reflect a benign lesion with postobstructive infection/inflammation, but tumor is not excluded.  Consider bronchoscopy with tissue sampling for further characterization.   Original Report Authenticated By: Charline Bills, M.D.     External Result Report     Impression:  The patient has a  2.9 x 2.5 cm nodular opacity with mild hypermetabolism in the right middle lobe and was found to have a visible mass in the medial segmental bronchus of the right middle lobe with biopsy-proven carcinoid tumor. I agree that surgical resection with right middle lobectomy is the best treatment for this patient. Given her cardiac history and symptoms I think it would be prudent to have her evaluated by cardiology before proceeding with surgery. We will need to stop her aspirin and Plavix for one week prior to surgery. She will require pulmonary function testing preoperatively to ensure that her lung function is adequate to tolerate surgery. Sometimes carcinoid tumors in this location will actually involve part of the upper lobe and necessitate bilobectomy. I discussed the operative procedure with the patient and her family including alternatives, benefits, and risks including but not limited to bleeding, blood transfusion, infection, bronchial stump complications, prolonged airleak, and recurrence of the tumor. She understands all this and agrees to proceed.  Plan:  We will have her evaluated by cardiology for preoperative clearance and will  obtain pulmonary function testing. If those are both satisfactory then we will schedule surgery. I told her that I would be happy to see her back after those 2 evaluations to discuss results with her and her family and answer any further questions.

## 2012-02-21 NOTE — Telephone Encounter (Signed)
Pt's son Caryn Bee called asking if his mother needs to keep her appointment tomorrow. She had a bronchoscopy and biopsy last week and will possibly need surgery. Per Dr. Arline Asp she does not need to come tomorrow and we will reschedule her appointment out 6 weeks.  Pt is aware.

## 2012-02-21 NOTE — Progress Notes (Signed)
PFT done today. 

## 2012-02-22 ENCOUNTER — Ambulatory Visit: Payer: Medicare Other | Admitting: Oncology

## 2012-02-22 ENCOUNTER — Encounter: Payer: Self-pay | Admitting: Nurse Practitioner

## 2012-02-24 ENCOUNTER — Telehealth: Payer: Self-pay | Admitting: Pulmonary Disease

## 2012-02-24 ENCOUNTER — Ambulatory Visit (INDEPENDENT_AMBULATORY_CARE_PROVIDER_SITE_OTHER): Payer: Medicare Other | Admitting: Nurse Practitioner

## 2012-02-24 ENCOUNTER — Encounter: Payer: Self-pay | Admitting: Nurse Practitioner

## 2012-02-24 VITALS — BP 145/85 | HR 52 | Ht 66.0 in | Wt 141.4 lb

## 2012-02-24 DIAGNOSIS — E785 Hyperlipidemia, unspecified: Secondary | ICD-10-CM | POA: Insufficient documentation

## 2012-02-24 DIAGNOSIS — I10 Essential (primary) hypertension: Secondary | ICD-10-CM | POA: Insufficient documentation

## 2012-02-24 DIAGNOSIS — R222 Localized swelling, mass and lump, trunk: Secondary | ICD-10-CM

## 2012-02-24 DIAGNOSIS — C3490 Malignant neoplasm of unspecified part of unspecified bronchus or lung: Secondary | ICD-10-CM

## 2012-02-24 DIAGNOSIS — R918 Other nonspecific abnormal finding of lung field: Secondary | ICD-10-CM

## 2012-02-24 DIAGNOSIS — I2510 Atherosclerotic heart disease of native coronary artery without angina pectoris: Secondary | ICD-10-CM

## 2012-02-24 NOTE — Telephone Encounter (Signed)
[email or web address].Coretta Leisey, CMA  

## 2012-02-24 NOTE — Telephone Encounter (Signed)
Called and spoke with pt.  Pt aware of PFT results.   

## 2012-02-24 NOTE — Patient Instructions (Signed)
Your physician recommends that you schedule a follow-up appointment in: 3 months with Dr. Cooper.  

## 2012-02-24 NOTE — Telephone Encounter (Signed)
Please let pt know that she has only very mild copd on her breathing tests, and should be able to do well with surgery.

## 2012-02-24 NOTE — Progress Notes (Signed)
Patient Name: [name] Date of Encounter: 02/24/2012  Primary Care Provider:  Gerarda Fraction, MD, MD Primary Cardiologist:  Judie Petit. Excell Seltzer, MD  Patient Profile  78 y/o female w/ h/o CAD s/p LCX & RCA interventions in 05/2011 who presents for pre-op clearance r/t lung CA.  Problem List   Past Medical History  Diagnosis Date  . Hypertension   . Dyslipidemia   . Breast cancer   . Cancer of eye   . Lung cancer     a. 2013 bronchoscopy - right middle lobe mass + for carcinoid  . Stroke   . CAD (coronary artery disease)     a. 05/2011 InfPost MI;  b. 05/2011 Cath/PCI RCA - DES;  c. 05/2011 Staged PCI LCX, residual mod LAD dzs;  d. 2012 nonischemic Myoveiw, EF 67%.   Past Surgical History  Procedure Date  . Mastectomy 1990    Bilateral  . Eye surgery   . Video bronchoscopy 02/14/2012    Procedure: VIDEO BRONCHOSCOPY WITH FLUORO;  Surgeon: Barbaraann Share, MD;  Location: [facility];  Service: Cardiopulmonary;  Laterality: Bilateral;    Allergies  Allergies  Allergen Reactions  . Penicillins Hives    HPI  78 y/o female with the above problem list.  Since her interventions last summer, she has done well from a cardiac standpoint.  She has not had any chest pain or activity limiting dyspnea.  She is being worked up by pulm/onc/thoracic surgery for RML Lung mass with recent bronch returning carcinoid pathology.  She is pending resection.  Of note, she came off of plavix approx 4 days prior to her bronch and resumed it immediately after.  Home Medications  Prior to Admission medications   Medication Sig Start Date End Date Taking? Authorizing Provider  aspirin 81 MG tablet Take 81 mg by mouth daily.    Yes Historical Provider, MD  benzonatate (TESSALON) 100 MG capsule Take 100 mg by mouth 3 (three) times daily as needed.   Yes Historical Provider, MD  carvedilol (COREG) 6.25 MG tablet Take 1 tablet (6.25 mg total) by mouth 2 (two) times daily with a meal. 11/18/11  Yes Tonny Bollman, MD  clopidogrel (PLAVIX) 75 MG tablet Take 1 tablet (75 mg total) by mouth daily. 06/07/11  Yes Tonny Bollman, MD  nitroGLYCERIN (NITROSTAT) 0.4 MG SL tablet Place 0.4 mg under the tongue every 5 (five) minutes as needed. For chest pains up to 3 doses. If pain still persist call emergency personal.    Yes Historical Provider, MD  simvastatin (ZOCOR) 40 MG tablet Take 1 tablet (40 mg total) by mouth at bedtime. 06/07/11  Yes Tonny Bollman, MD   Review of Systems Some degree of chronic DOE.  No chest pain, n, v, dizziness, syncope, edema, early satiety, dysuria, dark stools, blood in stools, diarrhea, rash/skin changes, fevers, chills, wt loss/gain.  Otherwise all systems reviewed and negative.   Physical Exam  Blood pressure 145/85, pulse 52, height 5\' 6"  (1.676 m), weight 141 lb 6.4 oz (64.139 kg).  General: Pleasant, NAD Psych: Normal affect. Neuro: Alert and oriented X 3. Moves all extremities spontaneously. HEENT: Normal  Neck: Supple without bruits or JVD. Lungs:  Resp regular and unlabored, CTA. Heart: RRR no s3, s4, or murmurs. Abdomen: Soft, non-tender, non-distended, BS + x 4.  Extremities: No clubbing, cyanosis or edema. DP/PT/Radials 2+ and equal bilaterally.  Accessory Clinical Findings  ECG - RSR, 75, LVH, no acute st/t changes.  Assessment & Plan  1.  CAD:  Doing well s/p multiple stents last summer.  Active w/o chest pain or dyspnea.  Myoview was performed last summer to eval ischemic burden of LAD dzs and this was non-ischemic.  She is pending thoracic surgery for Lung CA.  She will not require any additional ischemic evaluation prior to her surgery.  As she is greater than 6 months out from stenting, she should be ok, again, to hold her plavix preoperatively with an eye toward resuming it as soon as felt to be feasible by [facility].  Cont bb & statin throughout peri-op period.  2.  Carcinoid RML Lung Mass:  Pending surgery as above.  No additional ischemic eval  necessary.    3.  HTN:  BP elevated today though was lower on last visit with pulm.  Pt has cuff @ home.  Rec that she follow pressures @ home over the next week and call in if consistently > .  HR in 50's thus if she trends high, would not be able to titrate bb further.  Instead, would add Acei.  4.  HL:  Cont statin.  LDL 71 in Sept 2012.  5.  Dispo:  F/u Dr. Excell Seltzer in 2 months.   Nicolasa Ducking, NP 02/24/2012, 11:08 AM

## 2012-02-29 ENCOUNTER — Encounter: Payer: Self-pay | Admitting: Pulmonary Disease

## 2012-03-01 ENCOUNTER — Encounter: Payer: Self-pay | Admitting: Surgery

## 2012-03-02 ENCOUNTER — Encounter (HOSPITAL_COMMUNITY): Payer: Self-pay | Admitting: Pharmacy Technician

## 2012-03-02 ENCOUNTER — Other Ambulatory Visit: Payer: Self-pay

## 2012-03-02 DIAGNOSIS — D381 Neoplasm of uncertain behavior of trachea, bronchus and lung: Secondary | ICD-10-CM

## 2012-03-12 ENCOUNTER — Encounter (HOSPITAL_COMMUNITY)
Admission: RE | Admit: 2012-03-12 | Discharge: 2012-03-12 | Disposition: A | Discharge status: Home / Self Care | Payer: Medicare Other | Source: Ambulatory Visit | Attending: Surgery | Admitting: Surgery

## 2012-03-12 ENCOUNTER — Encounter (HOSPITAL_COMMUNITY): Payer: Self-pay

## 2012-03-12 ENCOUNTER — Ambulatory Visit (HOSPITAL_COMMUNITY)
Admission: RE | Admit: 2012-03-12 | Discharge: 2012-03-12 | Disposition: A | Discharge status: Home / Self Care | Payer: Medicare Other | Source: Ambulatory Visit | Attending: Surgery | Admitting: Surgery

## 2012-03-12 VITALS — BP 172/84 | HR 68 | Temp 98.4°F | Resp 18 | Ht 66.0 in | Wt 144.0 lb

## 2012-03-12 DIAGNOSIS — D381 Neoplasm of uncertain behavior of trachea, bronchus and lung: Secondary | ICD-10-CM

## 2012-03-12 DIAGNOSIS — Z01812 Encounter for preprocedural laboratory examination: Secondary | ICD-10-CM | POA: Insufficient documentation

## 2012-03-12 DIAGNOSIS — Z01810 Encounter for preprocedural cardiovascular examination: Secondary | ICD-10-CM | POA: Insufficient documentation

## 2012-03-12 DIAGNOSIS — Z01818 Encounter for other preprocedural examination: Secondary | ICD-10-CM | POA: Insufficient documentation

## 2012-03-12 DIAGNOSIS — R222 Localized swelling, mass and lump, trunk: Secondary | ICD-10-CM | POA: Insufficient documentation

## 2012-03-12 DIAGNOSIS — R9431 Abnormal electrocardiogram [ECG] [EKG]: Secondary | ICD-10-CM | POA: Insufficient documentation

## 2012-03-12 HISTORY — DX: Headache: R51

## 2012-03-12 HISTORY — DX: Shortness of breath: R06.02

## 2012-03-12 HISTORY — DX: Cardiac murmur, unspecified: R01.1

## 2012-03-12 HISTORY — DX: Acute myocardial infarction, unspecified: I21.9

## 2012-03-12 HISTORY — DX: Unspecified osteoarthritis, unspecified site: M19.90

## 2012-03-12 LAB — URINALYSIS, ROUTINE W REFLEX MICROSCOPIC
Bilirubin Urine: NEGATIVE
Ketones, ur: NEGATIVE mg/dL
Nitrite: POSITIVE — AB
Protein, ur: NEGATIVE mg/dL
Urobilinogen, UA: 0.2 mg/dL (ref 0.0–1.0)
pH: 7 (ref 5.0–8.0)

## 2012-03-12 LAB — PROTIME-INR
INR: 1.1 (ref 0.00–1.49)
Prothrombin Time: 14.4 seconds (ref 11.6–15.2)

## 2012-03-12 LAB — CBC
HCT: 34.2 % — ABNORMAL LOW (ref 36.0–46.0)
Hemoglobin: 11.6 g/dL — ABNORMAL LOW (ref 12.0–15.0)
MCH: 29.8 pg (ref 26.0–34.0)
MCV: 87.9 fL (ref 78.0–100.0)
RBC: 3.89 MIL/uL (ref 3.87–5.11)

## 2012-03-12 LAB — COMPREHENSIVE METABOLIC PANEL
AST: 26 U/L (ref 0–37)
Albumin: 3.2 g/dL — ABNORMAL LOW (ref 3.5–5.2)
BUN: 15 mg/dL (ref 6–23)
Calcium: 9.6 mg/dL (ref 8.4–10.5)
Creatinine, Ser: 0.71 mg/dL (ref 0.50–1.10)
Total Protein: 8.9 g/dL — ABNORMAL HIGH (ref 6.0–8.3)

## 2012-03-12 LAB — BLOOD GAS, ARTERIAL
Acid-Base Excess: 2.9 mmol/L — ABNORMAL HIGH (ref 0.0–2.0)
Bicarbonate: 26.4 mEq/L — ABNORMAL HIGH (ref 20.0–24.0)
O2 Saturation: 98 %
Patient temperature: 98.6
TCO2: 27.5 mmol/L (ref 0–100)
pO2, Arterial: 83.8 mmHg (ref 80.0–100.0)

## 2012-03-12 LAB — TYPE AND SCREEN
ABO/RH(D): O POS
Antibody Screen: NEGATIVE

## 2012-03-12 LAB — ABO/RH: ABO/RH(D): O POS

## 2012-03-12 LAB — URINE MICROSCOPIC-ADD ON

## 2012-03-12 NOTE — Pre-Procedure Instructions (Signed)
20 [name]  03/12/2012   Your procedure is scheduled on: Wednesday, May 15th.   Report to [facility] at 6:30 AM.  Call this number if you have problems the morning of surgery: [phone]   Remember:   Do not eat food:After Midnight.  May have clear liquids: up to 4 Hours before arrival.  (2:30)  Clear liquids include soda, tea, black coffee, apple or grape juice, broth.   Take these medicines the morning of surgery with A SIP OF WATER: Coreg, Tessalon.   Do not wear jewelry, make-up or nail polish.  Do not wear lotions, powders, or perfumes. You may wear deodorant.  Do not shave 48 hours prior to surgery. Men may shave face and neck.  Do not bring valuables to the hospital.  Contacts, dentures or bridgework may not be worn into surgery.  Leave suitcase in the car. After surgery it may be brought to your room.  For patients admitted to the hospital, checkout time is 11:00 AM the day of discharge.   Patients discharged the day of surgery will not be allowed to drive home.  Name and phone number of your driver: --  Special Instructions: CHG Shower Use Special Wash: 1/2 bottle night before surgery and 1/2 bottle morning of surgery.   Please read over the following fact sheets that you were given: Pain Booklet, Coughing and Deep Breathing, Blood Transfusion Information, MRSA Information and Surgical Site Infection Prevention

## 2012-03-12 NOTE — Pre-Procedure Instructions (Signed)
20 STELLAR GENSEL  03/12/2012   Your procedure is scheduled on:  Wednesday  03/14/12     Report to [facility] at 630 AM.  Call this number if you have problems the morning of surgery: [phone]   Remember:   Do not eat food:After Midnight.  May have clear liquids: up to 4 Hours before arrival.(UNTIL 230 AM)  Clear liquids include soda, tea, black coffee, apple or grape juice, broth.  Take these medicines the morning of surgery with A SIP OF WATER:  COREG   Do not wear jewelry, make-up or nail polish.  Do not wear lotions, powders, or perfumes. You may wear deodorant.  Do not shave 48 hours prior to surgery. Men may shave face and neck.  Do not bring valuables to the hospital.  Contacts, dentures or bridgework may not be worn into surgery.  Leave suitcase in the car. After surgery it may be brought to your room.  For patients admitted to the hospital, checkout time is 11:00 AM the day of discharge.   Patients discharged the day of surgery will not be allowed to drive home.  Name and phone number of your driver:  Special Instructions: CHG Shower Use Special Wash: 1/2 bottle night before surgery and 1/2 bottle morning of surgery.   Please read over the following fact sheets that you were given: Pain Booklet, MRSA Information and Surgical Site Infection Prevention

## 2012-03-13 ENCOUNTER — Ambulatory Visit (INDEPENDENT_AMBULATORY_CARE_PROVIDER_SITE_OTHER): Payer: Medicare Other | Admitting: Surgery

## 2012-03-13 ENCOUNTER — Encounter: Payer: Self-pay | Admitting: Surgery

## 2012-03-13 VITALS — BP 140/72 | HR 70 | Resp 18 | Ht 66.0 in | Wt 143.0 lb

## 2012-03-13 DIAGNOSIS — D3A00 Benign carcinoid tumor of unspecified site: Secondary | ICD-10-CM

## 2012-03-13 MED ORDER — VANCOMYCIN HCL IN DEXTROSE 1-5 GM/200ML-% IV SOLN
1000.0000 mg | INTRAVENOUS | Status: AC
  Administered 2012-03-14: 1000 mg via INTRAVENOUS
  Filled 2012-03-13: qty 200

## 2012-03-13 NOTE — Progress Notes (Signed)
HPI:  The patient returns today for evaluation of her pulmonary function tests and cardiology evaluation in anticipation of performing right thoracotomy and right middle lobectomy tomorrow for a carcinoid tumor of the right middle lobe. Review of her pulmonary function tests shows an FEV1 of 2.17 which is 105% of predicted. Her FVC is 3.29 which is 112% predicted. Her adjusted diffusion capacity is 86% predicted. Cardiology consultation was reviewed and cardiology did not feel that any further workup or intervention was needed prior to proceeding ahead with her surgery. The patient discontinue her aspirin and Plavix last Tuesday.  Current Outpatient Prescriptions  Medication Sig Dispense Refill  . benzonatate (TESSALON) 100 MG capsule Take 100 mg by mouth 3 (three) times daily as needed.      . carvedilol (COREG) 6.25 MG tablet Take 1 tablet (6.25 mg total) by mouth 2 (two) times daily with a meal.  90 tablet  3  . nitroGLYCERIN (NITROSTAT) 0.4 MG SL tablet Place 0.4 mg under the tongue every 5 (five) minutes as needed. For chest pains up to 3 doses. If pain still persist call emergency personal.       . simvastatin (ZOCOR) 40 MG tablet Take 1 tablet (40 mg total) by mouth at bedtime.  90 tablet  3  . aspirin 81 MG tablet Take 81 mg by mouth daily.       . clopidogrel (PLAVIX) 75 MG tablet Take 1 tablet (75 mg total) by mouth daily.  90 tablet  3   No current facility-administered medications for this visit.   Facility-Administered Medications Ordered in Other Visits  Medication Dose Route Frequency Provider Last Rate Last Dose  . vancomycin (VANCOCIN) IVPB 1000 mg/200 mL premix  1,000 mg Intravenous 60 min Pre-Op [name], MD         Physical Exam: BP 140/72  Pulse 70  Resp 18  Ht 5\' 6"  (1.676 m)  Wt 143 lb (64.864 kg)  BMI 23.08 kg/m2  SpO2 96% She looks well. Lung exam is clear. Cardiac exam shows regular rate and rhythm with normal heart sounds.  Diagnostic  Tests: None  Impression: She is stable for right thoracotomy and right middle lobectomy tomorrow. I discussed the operative procedure again with the patient and her son. We discussed alternatives, benefits, and risks including but not limited to bleeding, blood transfusion, infection, bronchial stump complications, possible need for resection of the upper or lower lobe if it is also involved with the tumor, respiratory failure, and she understands and agrees to proceed.  Plan: We will proceed with flexible fiberoptic bronchoscopy and right thoracotomy for a right middle lobectomy tomorrow.

## 2012-03-13 NOTE — Consult Note (Signed)
Anesthesia Chart Review:  Patient is a 78 year old female scheduled for right thoracotomy and RM lobectomy for lung CA (carcinoid) on 03/14/12. History includes former smoker, HTN, breast CA s/p left mastectomy '90, CAD/inferior MI  s/p LCx and RCA stents (DES) and known moderate LAD disease 05/2011, dyslipidemia, SOB, eye CA s/p surgery.  PCP is Dr. Gerarda Fraction.  Primary Cardiologist is Dr. Excell Seltzer.  Pulmonologist is Dr. Shelle Iron.  EKG on 03/12/12 showed NSR, possible LAE, cannot rule out anterior infarct (age undetermined).  Overall it appears stable.  She was seen by [facility] NP Ward Givens on 02/24/12 for preoperative evaluation.  No further preoperative cardiac testing was recommended.  Nuclear stress test on 06/01/11 (see Encounters tab) showed Small inferior wall infarct from apex to base with no ischemia, EF 67%.   Her last cath was from 05/06/11 and showed 90% LCx, 95% RCA lesions s/p staged interventions (DES).  Proximal LAD 30-40%, mid LAD 80% plaque, D1 70%.  EF 50%.  CXR on 03/12/12 showed RML mass.  PFT from 02/21/12 show mild obstruction, no restriction, normal DLCO.  Labs noted.  UA shows trace leukocytes, positive nitrites, many bacteria.  I called this result to Sherre Poot, RN at [facility].  Dr. Laneta Simmers is actually seeing her later today (03/13/12) to further discuss her procedure.  If remains asymptomatic from a CV standpoint, then anticipate she can proceed as planned.  Shonna Chock, PA-C

## 2012-03-14 ENCOUNTER — Encounter (HOSPITAL_COMMUNITY)
Admission: RE | Disposition: A | Discharge status: Home / Self Care | Payer: Self-pay | Source: Ambulatory Visit | Attending: Surgery

## 2012-03-14 ENCOUNTER — Encounter (HOSPITAL_COMMUNITY): Payer: Self-pay | Admitting: *Deleted

## 2012-03-14 ENCOUNTER — Encounter (HOSPITAL_COMMUNITY): Payer: Self-pay | Admitting: Vascular Surgery

## 2012-03-14 ENCOUNTER — Ambulatory Visit (HOSPITAL_COMMUNITY): Payer: Medicare Other | Admitting: Vascular Surgery

## 2012-03-14 ENCOUNTER — Inpatient Hospital Stay (HOSPITAL_COMMUNITY)
Admission: RE | Admit: 2012-03-14 | Discharge: 2012-03-23 | DRG: 164 | Disposition: A | Discharge status: Home / Self Care | Payer: Medicare Other | Source: Ambulatory Visit | Attending: Surgery | Admitting: Surgery

## 2012-03-14 ENCOUNTER — Inpatient Hospital Stay (HOSPITAL_COMMUNITY): Payer: Medicare Other

## 2012-03-14 DIAGNOSIS — E7800 Pure hypercholesterolemia, unspecified: Secondary | ICD-10-CM | POA: Diagnosis present

## 2012-03-14 DIAGNOSIS — D381 Neoplasm of uncertain behavior of trachea, bronchus and lung: Secondary | ICD-10-CM

## 2012-03-14 DIAGNOSIS — E876 Hypokalemia: Secondary | ICD-10-CM | POA: Diagnosis not present

## 2012-03-14 DIAGNOSIS — I10 Essential (primary) hypertension: Secondary | ICD-10-CM | POA: Diagnosis present

## 2012-03-14 DIAGNOSIS — Z85840 Personal history of malignant neoplasm of eye: Secondary | ICD-10-CM

## 2012-03-14 DIAGNOSIS — Z7982 Long term (current) use of aspirin: Secondary | ICD-10-CM

## 2012-03-14 DIAGNOSIS — I2119 ST elevation (STEMI) myocardial infarction involving other coronary artery of inferior wall: Secondary | ICD-10-CM

## 2012-03-14 DIAGNOSIS — Z880 Allergy status to penicillin: Secondary | ICD-10-CM

## 2012-03-14 DIAGNOSIS — Z853 Personal history of malignant neoplasm of breast: Secondary | ICD-10-CM

## 2012-03-14 DIAGNOSIS — J9383 Other pneumothorax: Secondary | ICD-10-CM | POA: Diagnosis not present

## 2012-03-14 DIAGNOSIS — Z87891 Personal history of nicotine dependence: Secondary | ICD-10-CM

## 2012-03-14 DIAGNOSIS — E785 Hyperlipidemia, unspecified: Secondary | ICD-10-CM | POA: Diagnosis present

## 2012-03-14 DIAGNOSIS — I2510 Atherosclerotic heart disease of native coronary artery without angina pectoris: Secondary | ICD-10-CM | POA: Diagnosis present

## 2012-03-14 DIAGNOSIS — C342 Malignant neoplasm of middle lobe, bronchus or lung: Principal | ICD-10-CM | POA: Diagnosis present

## 2012-03-14 DIAGNOSIS — Z902 Acquired absence of lung [part of]: Secondary | ICD-10-CM

## 2012-03-14 DIAGNOSIS — I252 Old myocardial infarction: Secondary | ICD-10-CM

## 2012-03-14 DIAGNOSIS — Z9889 Other specified postprocedural states: Secondary | ICD-10-CM

## 2012-03-14 DIAGNOSIS — Z79899 Other long term (current) drug therapy: Secondary | ICD-10-CM

## 2012-03-14 DIAGNOSIS — Z7902 Long term (current) use of antithrombotics/antiplatelets: Secondary | ICD-10-CM

## 2012-03-14 HISTORY — PX: FLEXIBLE BRONCHOSCOPY: SHX5094

## 2012-03-14 LAB — GLUCOSE, CAPILLARY: Glucose-Capillary: 198 mg/dL — ABNORMAL HIGH (ref 70–99)

## 2012-03-14 SURGERY — BRONCHOSCOPY, FLEXIBLE
Anesthesia: General | Site: Chest | Laterality: Right | Wound class: Clean Contaminated

## 2012-03-14 MED ORDER — POTASSIUM CHLORIDE 10 MEQ/50ML IV SOLN
10.0000 meq | Freq: Every day | INTRAVENOUS | Status: DC | PRN
  Administered 2012-03-15 – 2012-03-17 (×5): 10 meq via INTRAVENOUS
  Filled 2012-03-14: qty 100
  Filled 2012-03-14: qty 50
  Filled 2012-03-14: qty 150
  Filled 2012-03-14 (×3): qty 50

## 2012-03-14 MED ORDER — DEXTROSE IN LACTATED RINGERS 5 % IV SOLN
INTRAVENOUS | Status: DC
  Administered 2012-03-16: 19:00:00 via INTRAVENOUS

## 2012-03-14 MED ORDER — MOXIFLOXACIN HCL IN NACL 400 MG/250ML IV SOLN
400.0000 mg | INTRAVENOUS | Status: DC
  Administered 2012-03-15 – 2012-03-19 (×5): 400 mg via INTRAVENOUS
  Filled 2012-03-14 (×5): qty 250

## 2012-03-14 MED ORDER — ACETAMINOPHEN 10 MG/ML IV SOLN
1000.0000 mg | Freq: Four times a day (QID) | INTRAVENOUS | Status: AC
  Administered 2012-03-14 – 2012-03-15 (×4): 1000 mg via INTRAVENOUS
  Filled 2012-03-14 (×4): qty 100

## 2012-03-14 MED ORDER — LUNG SURGERY BOOK
Freq: Once | Status: AC
  Administered 2012-03-14: 20:00:00
  Filled 2012-03-14: qty 1

## 2012-03-14 MED ORDER — 0.9 % SODIUM CHLORIDE (POUR BTL) OPTIME
TOPICAL | Status: DC | PRN
  Administered 2012-03-14: 4000 mL

## 2012-03-14 MED ORDER — ROCURONIUM BROMIDE 100 MG/10ML IV SOLN
INTRAVENOUS | Status: DC | PRN
  Administered 2012-03-14: 50 mg via INTRAVENOUS

## 2012-03-14 MED ORDER — INSULIN ASPART 100 UNIT/ML ~~LOC~~ SOLN
0.0000 [IU] | SUBCUTANEOUS | Status: DC
  Administered 2012-03-14: 4 [IU] via SUBCUTANEOUS
  Administered 2012-03-15 – 2012-03-18 (×14): 2 [IU] via SUBCUTANEOUS

## 2012-03-14 MED ORDER — TRAMADOL HCL 50 MG PO TABS
50.0000 mg | ORAL_TABLET | Freq: Four times a day (QID) | ORAL | Status: DC | PRN
  Administered 2012-03-15 – 2012-03-18 (×2): 50 mg via ORAL
  Filled 2012-03-14 (×2): qty 1

## 2012-03-14 MED ORDER — MOXIFLOXACIN HCL IN NACL 400 MG/250ML IV SOLN
INTRAVENOUS | Status: DC | PRN
  Administered 2012-03-14: 400 mg via INTRAVENOUS

## 2012-03-14 MED ORDER — HYDROMORPHONE 0.3 MG/ML IV SOLN
INTRAVENOUS | Status: AC
  Filled 2012-03-14: qty 25

## 2012-03-14 MED ORDER — NEOSTIGMINE METHYLSULFATE 1 MG/ML IJ SOLN
INTRAMUSCULAR | Status: DC | PRN
  Administered 2012-03-14: 4 mg via INTRAVENOUS

## 2012-03-14 MED ORDER — HYDROMORPHONE 0.3 MG/ML IV SOLN
INTRAVENOUS | Status: DC
  Administered 2012-03-14: 0.2 mg via INTRAVENOUS
  Administered 2012-03-14: 17:00:00 via INTRAVENOUS
  Administered 2012-03-15: 0.4 mg via INTRAVENOUS
  Administered 2012-03-15: 0.2 mg via INTRAVENOUS
  Administered 2012-03-15: 0.4 mg via INTRAVENOUS
  Administered 2012-03-15: 0.399 mg via INTRAVENOUS
  Administered 2012-03-16: 0.199 mg via INTRAVENOUS
  Administered 2012-03-16 (×2): 0.4 mg via INTRAVENOUS
  Administered 2012-03-16: 0.6 mg via INTRAVENOUS
  Administered 2012-03-16: 0.4 mg via INTRAVENOUS
  Administered 2012-03-17 (×2): 0.8 mg via INTRAVENOUS
  Administered 2012-03-18: 0.2 mg via INTRAVENOUS
  Administered 2012-03-18: 1.19 mg via INTRAVENOUS
  Administered 2012-03-18: 0.799 mg via INTRAVENOUS
  Administered 2012-03-18: 0.999 mg via INTRAVENOUS
  Administered 2012-03-18: 0.799 mg via INTRAVENOUS
  Administered 2012-03-18: 1.79 mg via INTRAVENOUS
  Administered 2012-03-19: 1.39 mg via INTRAVENOUS
  Administered 2012-03-19: 0.4 mg via INTRAVENOUS
  Administered 2012-03-19: 0.99 mg via INTRAVENOUS
  Administered 2012-03-19: 0.599 mg via INTRAVENOUS
  Administered 2012-03-19: 08:00:00 via INTRAVENOUS
  Administered 2012-03-19: 0.599 mg via INTRAVENOUS
  Administered 2012-03-20: 08:00:00 via INTRAVENOUS
  Administered 2012-03-20: 1.59 mg via INTRAVENOUS
  Administered 2012-03-20: 0.999 mg via INTRAVENOUS
  Filled 2012-03-14 (×4): qty 25

## 2012-03-14 MED ORDER — HEMOSTATIC AGENTS (NO CHARGE) OPTIME
TOPICAL | Status: DC | PRN
  Administered 2012-03-14: 1 via TOPICAL

## 2012-03-14 MED ORDER — BUPIVACAINE 0.5 % ON-Q PUMP SINGLE CATH 400 ML
400.0000 mL | INJECTION | Status: AC
  Administered 2012-03-14: 400 mL
  Filled 2012-03-14: qty 400

## 2012-03-14 MED ORDER — BISACODYL 5 MG PO TBEC
10.0000 mg | DELAYED_RELEASE_TABLET | Freq: Every day | ORAL | Status: DC
  Administered 2012-03-15 – 2012-03-23 (×9): 10 mg via ORAL
  Filled 2012-03-14 (×9): qty 2

## 2012-03-14 MED ORDER — SENNOSIDES-DOCUSATE SODIUM 8.6-50 MG PO TABS
1.0000 | ORAL_TABLET | Freq: Every evening | ORAL | Status: DC | PRN
  Administered 2012-03-21 – 2012-03-22 (×2): 1 via ORAL
  Filled 2012-03-14 (×3): qty 1

## 2012-03-14 MED ORDER — PROPOFOL 10 MG/ML IV EMUL
INTRAVENOUS | Status: DC | PRN
  Administered 2012-03-14: 100 mg via INTRAVENOUS
  Administered 2012-03-14: 20 mg via INTRAVENOUS
  Administered 2012-03-14: 50 mg via INTRAVENOUS
  Administered 2012-03-14: 30 mg via INTRAVENOUS

## 2012-03-14 MED ORDER — LACTATED RINGERS IV SOLN
INTRAVENOUS | Status: DC | PRN
  Administered 2012-03-14 (×2): via INTRAVENOUS

## 2012-03-14 MED ORDER — VECURONIUM BROMIDE 10 MG IV SOLR
INTRAVENOUS | Status: DC | PRN
  Administered 2012-03-14 (×7): 1 mg via INTRAVENOUS

## 2012-03-14 MED ORDER — NALOXONE HCL 0.4 MG/ML IJ SOLN: 0.4000 mg | INTRAMUSCULAR | Status: DC | PRN

## 2012-03-14 MED ORDER — FENTANYL CITRATE 0.05 MG/ML IJ SOLN
INTRAMUSCULAR | Status: DC | PRN
  Administered 2012-03-14 (×3): 50 ug via INTRAVENOUS
  Administered 2012-03-14 (×2): 25 ug via INTRAVENOUS
  Administered 2012-03-14 (×6): 50 ug via INTRAVENOUS

## 2012-03-14 MED ORDER — SIMVASTATIN 40 MG PO TABS
40.0000 mg | ORAL_TABLET | Freq: Every day | ORAL | Status: DC
  Administered 2012-03-14 – 2012-03-22 (×9): 40 mg via ORAL
  Filled 2012-03-14 (×11): qty 1

## 2012-03-14 MED ORDER — OXYCODONE HCL 5 MG PO TABS: 5.0000 mg | ORAL_TABLET | ORAL | Status: AC | PRN

## 2012-03-14 MED ORDER — OXYCODONE-ACETAMINOPHEN 5-325 MG PO TABS: 1.0000 | ORAL_TABLET | ORAL | Status: DC | PRN

## 2012-03-14 MED ORDER — SODIUM CHLORIDE 0.9 % IJ SOLN: 9.0000 mL | INTRAMUSCULAR | Status: DC | PRN

## 2012-03-14 MED ORDER — DIPHENHYDRAMINE HCL 50 MG/ML IJ SOLN: 12.5000 mg | Freq: Four times a day (QID) | INTRAMUSCULAR | Status: DC | PRN

## 2012-03-14 MED ORDER — HYDROMORPHONE HCL PF 1 MG/ML IJ SOLN: 0.2500 mg | INTRAMUSCULAR | Status: DC | PRN

## 2012-03-14 MED ORDER — ONDANSETRON HCL 4 MG/2ML IJ SOLN
4.0000 mg | Freq: Four times a day (QID) | INTRAMUSCULAR | Status: DC | PRN
  Administered 2012-03-16 – 2012-03-17 (×3): 4 mg via INTRAVENOUS
  Filled 2012-03-14 (×3): qty 2

## 2012-03-14 MED ORDER — MORPHINE SULFATE 4 MG/ML IJ SOLN: 0.0500 mg/kg | INTRAMUSCULAR | Status: DC | PRN

## 2012-03-14 MED ORDER — EPHEDRINE SULFATE 50 MG/ML IJ SOLN
INTRAMUSCULAR | Status: DC | PRN
  Administered 2012-03-14 (×3): 5 mg via INTRAVENOUS

## 2012-03-14 MED ORDER — MIDAZOLAM HCL 5 MG/5ML IJ SOLN
INTRAMUSCULAR | Status: DC | PRN
  Administered 2012-03-14: 2 mg via INTRAVENOUS

## 2012-03-14 MED ORDER — HYDRALAZINE HCL 20 MG/ML IJ SOLN
20.0000 mg | Freq: Once | INTRAMUSCULAR | Status: AC
  Administered 2012-03-14 (×2): 10 mg via INTRAVENOUS

## 2012-03-14 MED ORDER — ONDANSETRON HCL 4 MG/2ML IJ SOLN: 4.0000 mg | Freq: Four times a day (QID) | INTRAMUSCULAR | Status: DC | PRN

## 2012-03-14 MED ORDER — HYDROMORPHONE HCL PF 1 MG/ML IJ SOLN
0.2500 mg | INTRAMUSCULAR | Status: DC | PRN
  Administered 2012-03-14 (×2): 0.5 mg via INTRAVENOUS

## 2012-03-14 MED ORDER — DIPHENHYDRAMINE HCL 12.5 MG/5ML PO ELIX
12.5000 mg | ORAL_SOLUTION | Freq: Four times a day (QID) | ORAL | Status: DC | PRN
  Filled 2012-03-14: qty 5

## 2012-03-14 MED ORDER — CARVEDILOL 6.25 MG PO TABS
6.2500 mg | ORAL_TABLET | Freq: Two times a day (BID) | ORAL | Status: DC
  Administered 2012-03-14 – 2012-03-23 (×17): 6.25 mg via ORAL
  Filled 2012-03-14 (×22): qty 1

## 2012-03-14 MED ORDER — OXYCODONE-ACETAMINOPHEN 5-325 MG PO TABS
1.0000 | ORAL_TABLET | ORAL | Status: DC | PRN
  Administered 2012-03-20 – 2012-03-23 (×10): 2 via ORAL
  Filled 2012-03-14 (×10): qty 2

## 2012-03-14 MED ORDER — MOXIFLOXACIN HCL IN NACL 400 MG/250ML IV SOLN
400.0000 mg | INTRAVENOUS | Status: DC
  Filled 2012-03-14: qty 250

## 2012-03-14 MED ORDER — GLYCOPYRROLATE 0.2 MG/ML IJ SOLN
INTRAMUSCULAR | Status: DC | PRN
  Administered 2012-03-14: 0.6 mg via INTRAVENOUS

## 2012-03-14 MED ORDER — LEVALBUTEROL HCL 0.63 MG/3ML IN NEBU
0.6300 mg | INHALATION_SOLUTION | Freq: Four times a day (QID) | RESPIRATORY_TRACT | Status: DC
  Administered 2012-03-15 – 2012-03-16 (×8): 0.63 mg via RESPIRATORY_TRACT
  Filled 2012-03-14 (×11): qty 3

## 2012-03-14 MED ORDER — ONDANSETRON HCL 4 MG/2ML IJ SOLN
INTRAMUSCULAR | Status: DC | PRN
  Administered 2012-03-14: 4 mg via INTRAVENOUS

## 2012-03-14 MED ORDER — BUPIVACAINE ON-Q PAIN PUMP (FOR ORDER SET NO CHG)
INJECTION | Status: AC
  Filled 2012-03-14: qty 1

## 2012-03-14 MED ORDER — SODIUM CHLORIDE 0.9 % IV SOLN
INTRAVENOUS | Status: DC | PRN
  Administered 2012-03-14 (×2): via INTRAVENOUS

## 2012-03-14 MED ORDER — LACTATED RINGERS IV SOLN
INTRAVENOUS | Status: DC | PRN
  Administered 2012-03-14 (×2): via INTRAVENOUS

## 2012-03-14 MED ORDER — ASPIRIN EC 81 MG PO TBEC
81.0000 mg | DELAYED_RELEASE_TABLET | Freq: Every day | ORAL | Status: DC
  Administered 2012-03-14 – 2012-03-23 (×10): 81 mg via ORAL
  Filled 2012-03-14 (×11): qty 1

## 2012-03-14 MED ORDER — VANCOMYCIN HCL IN DEXTROSE 1-5 GM/200ML-% IV SOLN
1000.0000 mg | Freq: Two times a day (BID) | INTRAVENOUS | Status: AC
  Administered 2012-03-14: 1000 mg via INTRAVENOUS
  Filled 2012-03-14: qty 200

## 2012-03-14 MED ORDER — ASPIRIN 81 MG PO TABS: 81.0000 mg | ORAL_TABLET | Freq: Every day | ORAL | Status: DC

## 2012-03-14 SURGICAL SUPPLY — 96 items
APPLICATOR TIP EXT COSEAL (VASCULAR PRODUCTS) IMPLANT
BRUSH CYTOL CELLEBRITY 1.5X140 (MISCELLANEOUS) IMPLANT
CANISTER SUCTION 2500CC (MISCELLANEOUS) ×3 IMPLANT
CATH KIT ON Q 5IN SLV (PAIN MANAGEMENT) ×3 IMPLANT
CATH THORACIC 28FR (CATHETERS) ×3 IMPLANT
CATH THORACIC 36FR (CATHETERS) IMPLANT
CATH THORACIC 36FR RT ANG (CATHETERS) IMPLANT
CLIP TI MEDIUM 24 (CLIP) ×3 IMPLANT
CLIP TI MEDIUM 6 (CLIP) ×6 IMPLANT
CLOTH BEACON ORANGE TIMEOUT ST (SAFETY) ×3 IMPLANT
CONN ST 1/4X3/8  BEN (MISCELLANEOUS) ×1
CONN ST 1/4X3/8 BEN (MISCELLANEOUS) ×2 IMPLANT
CONN Y 3/8X3/8X3/8  BEN (MISCELLANEOUS) ×1
CONN Y 3/8X3/8X3/8 BEN (MISCELLANEOUS) ×2 IMPLANT
CONT SPEC 4OZ CLIKSEAL STRL BL (MISCELLANEOUS) ×9 IMPLANT
COTTONBALL LRG STERILE PKG (GAUZE/BANDAGES/DRESSINGS) IMPLANT
COVER SURGICAL LIGHT HANDLE (MISCELLANEOUS) ×6 IMPLANT
COVER TABLE BACK 60X90 (DRAPES) ×3 IMPLANT
DRAIN CHANNEL 32F RND 10.7 FF (WOUND CARE) ×3 IMPLANT
DRAPE LAPAROSCOPIC ABDOMINAL (DRAPES) ×3 IMPLANT
DRAPE SLUSH MACHINE 52X66 (DRAPES) IMPLANT
DRAPE SLUSH/WARMER DISC (DRAPES) IMPLANT
ELECT REM PT RETURN 9FT ADLT (ELECTROSURGICAL) ×3
ELECTRODE REM PT RTRN 9FT ADLT (ELECTROSURGICAL) ×2 IMPLANT
FORCEPS BIOP RJ4 1.8 (CUTTING FORCEPS) IMPLANT
GLOVE BIO SURGEON STRL SZ 6 (GLOVE) ×6 IMPLANT
GLOVE BIO SURGEON STRL SZ 6.5 (GLOVE) ×3 IMPLANT
GLOVE BIOGEL PI IND STRL 6.5 (GLOVE) ×4 IMPLANT
GLOVE BIOGEL PI IND STRL 7.5 (GLOVE) ×4 IMPLANT
GLOVE BIOGEL PI INDICATOR 6.5 (GLOVE) ×2
GLOVE BIOGEL PI INDICATOR 7.5 (GLOVE) ×2
GLOVE EUDERMIC 7 POWDERFREE (GLOVE) ×6 IMPLANT
GLOVE SURG SS PI 7.0 STRL IVOR (GLOVE) ×6 IMPLANT
GLOVE SURG SS PI 7.5 STRL IVOR (GLOVE) ×6 IMPLANT
GOWN PREVENTION PLUS XLARGE (GOWN DISPOSABLE) ×9 IMPLANT
GOWN STRL NON-REIN LRG LVL3 (GOWN DISPOSABLE) ×12 IMPLANT
GOWN STRL REIN XL XLG (GOWN DISPOSABLE) ×6 IMPLANT
HANDLE STAPLE ENDO GIA SHORT (STAPLE) ×2
HEMOSTAT SURGICEL 2X14 (HEMOSTASIS) IMPLANT
KIT BASIN OR (CUSTOM PROCEDURE TRAY) ×3 IMPLANT
KIT ROOM TURNOVER OR (KITS) ×3 IMPLANT
MARKER SKIN DUAL TIP RULER LAB (MISCELLANEOUS) ×3 IMPLANT
NEEDLE 22X1 1/2 (OR ONLY) (NEEDLE) IMPLANT
NEEDLE BIOPSY TRANSBRONCH 21G (NEEDLE) IMPLANT
NS IRRIG 1000ML POUR BTL (IV SOLUTION) ×12 IMPLANT
OIL SILICONE PENTAX (PARTS (SERVICE/REPAIRS)) ×3 IMPLANT
PACK CHEST (CUSTOM PROCEDURE TRAY) ×3 IMPLANT
PAD ARMBOARD 7.5X6 YLW CONV (MISCELLANEOUS) ×6 IMPLANT
RELOAD EGIA 45 MED/THCK PURPLE (STAPLE) ×3 IMPLANT
RELOAD EGIA 60 MED/THCK PURPLE (STAPLE) ×15 IMPLANT
RELOAD EGIA 60 TAN VASC (STAPLE) ×3 IMPLANT
RELOAD EGIA BLACK ROTIC 45MM (STAPLE) ×3 IMPLANT
RELOAD EGIA TRIS TAN 45 CVD (STAPLE) ×9 IMPLANT
RELOAD STAPLE TA30 4.8 GRN (STAPLE) ×3 IMPLANT
RELOAD TRI 2.0 60 XTHK VAS SUL (STAPLE) ×3 IMPLANT
SEALANT PROGEL (MISCELLANEOUS) ×3 IMPLANT
SEALANT SURG COSEAL 4ML (VASCULAR PRODUCTS) IMPLANT
SEALANT SURG COSEAL 8ML (VASCULAR PRODUCTS) IMPLANT
SOLUTION ANTI FOG 6CC (MISCELLANEOUS) ×3 IMPLANT
SPECIMEN JAR LG PLASTIC EMPTY (MISCELLANEOUS) ×6 IMPLANT
SPONGE GAUZE 4X4 12PLY (GAUZE/BANDAGES/DRESSINGS) ×3 IMPLANT
SPONGE INTESTINAL PEANUT (DISPOSABLE) ×3 IMPLANT
STAPLER ENDO GIA 12MM SHORT (STAPLE) ×4 IMPLANT
STAPLER TA30 4.8 NON-ABS (STAPLE) ×3 IMPLANT
SUT PROLENE 3 0 SH DA (SUTURE) IMPLANT
SUT PROLENE 4 0 RB 1 (SUTURE)
SUT PROLENE 4-0 RB1 .5 CRCL 36 (SUTURE) IMPLANT
SUT SILK  1 MH (SUTURE) ×2
SUT SILK 1 MH (SUTURE) ×4 IMPLANT
SUT SILK 2 0 SH (SUTURE) IMPLANT
SUT SILK 2 0SH CR/8 30 (SUTURE) ×3 IMPLANT
SUT VIC AB 1 CTX 18 (SUTURE) ×3 IMPLANT
SUT VIC AB 1 CTX 36 (SUTURE) ×1
SUT VIC AB 1 CTX36XBRD ANBCTR (SUTURE) ×2 IMPLANT
SUT VIC AB 2-0 CTX 36 (SUTURE) ×3 IMPLANT
SUT VIC AB 3-0 X1 27 (SUTURE) ×3 IMPLANT
SUT VICRYL 2 TP 1 (SUTURE) ×3 IMPLANT
SWAB COLLECTION DEVICE MRSA (MISCELLANEOUS) ×3 IMPLANT
SYR 20ML ECCENTRIC (SYRINGE) IMPLANT
SYR 5ML LUER SLIP (SYRINGE) IMPLANT
SYR CONTROL 10ML LL (SYRINGE) ×3 IMPLANT
SYSTEM SAHARA CHEST DRAIN RE-I (WOUND CARE) ×3 IMPLANT
TAPE CLOTH 4X10 WHT NS (GAUZE/BANDAGES/DRESSINGS) ×3 IMPLANT
TAPE CLOTH SURG 4X10 WHT LF (GAUZE/BANDAGES/DRESSINGS) ×3 IMPLANT
TIP APPLICATOR SPRAY EXTEND 16 (VASCULAR PRODUCTS) ×6 IMPLANT
TOWEL OR 17X24 6PK STRL BLUE (TOWEL DISPOSABLE) ×6 IMPLANT
TOWEL OR 17X26 10 PK STRL BLUE (TOWEL DISPOSABLE) ×6 IMPLANT
TRAP SPECIMEN MUCOUS 40CC (MISCELLANEOUS) ×3 IMPLANT
TRAY FOLEY CATH 14FRSI W/METER (CATHETERS) ×3 IMPLANT
TUBE ANAEROBIC SPECIMEN COL (MISCELLANEOUS) IMPLANT
TUBE CONNECTING 12X1/4 (SUCTIONS) IMPLANT
TUNNELER SHEATH ON-Q 11GX8 (MISCELLANEOUS) IMPLANT
VALVE BIOPSY  SINGLE USE (MISCELLANEOUS)
VALVE BIOPSY SINGLE USE (MISCELLANEOUS) IMPLANT
VALVE SUCTION BRONCHIO DISP (MISCELLANEOUS) IMPLANT
WATER STERILE IRR 1000ML POUR (IV SOLUTION) ×6 IMPLANT

## 2012-03-14 NOTE — Anesthesia Procedure Notes (Addendum)
Procedure Name: Intubation Date/Time: 03/14/2012 9:01 AM Performed by: Ellin Goodie Pre-anesthesia Checklist: Patient identified, Emergency Drugs available, Suction available, Patient being monitored and Timeout performed Patient Re-evaluated:Patient Re-evaluated prior to inductionOxygen Delivery Method: Circle system utilized Preoxygenation: Pre-oxygenation with 100% oxygen Intubation Type: IV induction Ventilation: Mask ventilation without difficulty Laryngoscope Size: Mac and 3 Grade View: Grade I Tube type: Oral Tube size: 8.5 mm Number of attempts: 1 Airway Equipment and Method: Stylet Placement Confirmation: ETT inserted through vocal cords under direct vision,  positive ETCO2 and breath sounds checked- equal and bilateral Secured at: 22 cm Tube secured with: Tape Dental Injury: Teeth and Oropharynx as per pre-operative assessment  Comments: DL x 1 by [employer], SRNA    Procedure Name: Intubation Date/Time: 03/14/2012 9:19 AM Performed by: Ellin Goodie Pre-anesthesia Checklist: Patient identified, Emergency Drugs available, Suction available, Patient being monitored and Timeout performed Patient Re-evaluated:Patient Re-evaluated prior to inductionOxygen Delivery Method: Circle system utilized Preoxygenation: Pre-oxygenation with 100% oxygen Intubation Type: IV induction Ventilation: Mask ventilation without difficulty Laryngoscope Size: Mac and 3 Grade View: Grade I Tube type: Oral Endobronchial tube: Left and EBT position confirmed by fiberoptic bronchoscope and 37 Fr Number of attempts: 1 Airway Equipment and Method: Stylet Placement Confirmation: ETT inserted through vocal cords under direct vision,  positive ETCO2 and breath sounds checked- equal and bilateral Secured at: 31 cm Tube secured with: Tape Dental Injury: Teeth and Oropharynx as per pre-operative assessment    Procedure RIJ CVP dual lumen: 1610-9604: The patient was identified and consent  obtained.  TO was performed, and full barrier precautions were used.  The skin was anesthetized with lidocaine.  Once the vein was located with the 22 ga. needle using ultrasound guidance , the wire was inserted into the vein.  The wire location was confirmed with ultrasound.  The tissue was dilated and the catheter was carefully inserted, then sutured in place. A dressing was applied. The patient tolerated the procedure well.   CE

## 2012-03-14 NOTE — Preoperative (Signed)
Beta Blockers   Reason not to administer Beta Blockers:Not Applicable 

## 2012-03-14 NOTE — Anesthesia Preprocedure Evaluation (Addendum)
Anesthesia Evaluation  Patient identified by MRN, date of birth, ID band Patient awake    Reviewed: Allergy & Precautions, H&P , NPO status , Patient's Chart, lab work & pertinent test results, reviewed documented beta blocker date and time   Airway Mallampati: II TM Distance: >3 FB Neck ROM: Full    Dental No notable dental hx. (+) Edentulous Upper, Partial Lower and Dental Advisory Given   Pulmonary shortness of breath and with exertion,  breath sounds clear to auscultation  Pulmonary exam normal       Cardiovascular hypertension, On Medications and On Home Beta Blockers + CAD and + Past MI negative cardio ROS  Rhythm:Regular Rate:Normal     Neuro/Psych  Headaches, negative psych ROS   GI/Hepatic negative GI ROS, Neg liver ROS,   Endo/Other  negative endocrine ROS  Renal/GU negative Renal ROS  negative genitourinary   Musculoskeletal   Abdominal (+)  Abdomen: soft. Bowel sounds: normal.  Peds  Hematology negative hematology ROS (+)   Anesthesia Other Findings   Reproductive/Obstetrics negative OB ROS                         Anesthesia Physical Anesthesia Plan  ASA: III  Anesthesia Plan: General   Post-op Pain Management:    Induction: Intravenous  Airway Management Planned: Double Lumen EBT  Additional Equipment: Arterial line, CVP and Ultrasound Guidance Line Placement  Intra-op Plan:   Post-operative Plan: Extubation in OR and Possible Post-op intubation/ventilation  Informed Consent: I have reviewed the patients History and Physical, chart, labs and discussed the procedure including the risks, benefits and alternatives for the proposed anesthesia with the patient or authorized representative who has indicated his/her understanding and acceptance.   Dental advisory given  Plan Discussed with: CRNA  Anesthesia Plan Comments:         Anesthesia Quick Evaluation

## 2012-03-14 NOTE — Brief Op Note (Addendum)
03/14/2012  2:29 PM  PATIENT:  [name]  78 y.o. female  PRE-OPERATIVE DIAGNOSIS:  Right Middle Lobe Carcinoid Tumor POST-OPERATIVE DIAGNOSIS: Same  PROCEDURE:  Procedure(s) (LRB): FLEXIBLE BRONCHOSCOPY (N/A) Right Thoracotomy with right middle and lower lobectomy  SURGEON:  Surgeon(s) and Role:    * [name], MD - Primary  PHYSICIAN ASSISTANT: Coral Ceo, PA-C   ANESTHESIA:   general  EBL:  Total I/O In: 3350 [I.V.:3350] Out: 1100 [Urine:600; Blood:500]  BLOOD ADMINISTERED:none  DRAINS: 1 28 F chest tube anteriorly and 1 82F Blake drain posteriorly  LOCAL MEDICATIONS USED:  NONE  SPECIMEN:  Source of Specimen:  Right middle and lower lobes  DISPOSITION OF SPECIMEN:  PATHOLOGY  COUNTS:  YES    PLAN OF CARE: Admit to inpatient   PATIENT DISPOSITION:  PACU - hemodynamically stable.   Delay start of Pharmacological VTE agent (>24hrs) due to surgical blood loss or risk of bleeding: yes  Right Middle lobectomy performed but bronchial stump had positive margin on frozen section. Therefore bronchus intermedius divided just distal to right upper lobe bronchus and right lower lobe removed.  Multiple [name] lymph nodes around bronchus extending to subcarinal region.

## 2012-03-14 NOTE — H&P (Signed)
[address]            [name] [address]          [phone]     PCP is Gerarda Fraction, MD, MD Referring Provider is Clance, Maree Krabbe, MD    Chief Complaint   Patient presents with   .  Lung Cancer       eval and treat....Dr. Tonny Bollman...cardiologist     HPI:  The patient is a 78 year old previous smoker who presented to the emergency room earlier this month with an episode of hemoptysis that was initially dark in color and then bright red. She had an abnormal chest x-ray showing a right hilar lung mass and CT scan showed a 2.9 cm multilobulated soft tissue mass  below the right hilum with extension peripherally along the right major fissure. CT scan also showed a 3.3 cm mass in the right adrenal gland with a central focus of increased attenuation that was nonspecific in appearance. A PET scan showed mild hypermetabolism along the lateral margin of this lesion with a maximum SUV of 4.3. There were no hypermetabolic mediastinal or hilar lymph nodes. There was no other significant hypermetabolic uptake. The patient subsequently underwent bronchoscopy by Dr.Clance showing a visible mass in the medial segment of the right middle lobe bronchus with biopsies positive for carcinoid.    Past Medical History   Diagnosis  Date   .  Hypertension     .  Dyslipidemia     .  Breast cancer     .  Cancer of eye     .  Myocardial infarct     .  Lung mass     .  Stroke         Past Surgical History   Procedure  Date   .  Mastectomy  1990       Bilateral   .  Eye surgery     .  Video bronchoscopy  02/14/2012       Procedure: VIDEO BRONCHOSCOPY WITH FLUORO;  Surgeon: Barbaraann Share, MD;  Location: [facility];  Service: Cardiopulmonary;  Laterality: Bilateral;       Family History   Problem  Relation  Age of Onset   .  Coronary artery disease  Neg Hx         Premature   .  Emphysema  Father     .  Heart disease  Brother     .  Heart disease  Son         Social History History   Substance Use Topics   .  Smoking status:  Former Smoker -- 1.0 packs/day for 25 years       Types:  Cigarettes       Quit date:  10/31/1988   .  Smokeless tobacco:  Never Used   .  Alcohol Use:  No       Current Outpatient Prescriptions   Medication  Sig  Dispense  Refill   .  aspirin 81 MG tablet  Take 81 mg by mouth daily.          .  benzonatate (TESSALON) 100 MG capsule  Take 100 mg by mouth 3 (three) times daily as needed.         .  carvedilol (COREG) 6.25 MG tablet  Take 1 tablet (6.25 mg total) by mouth 2 (  two) times daily with a meal.   90 tablet   3   .  clopidogrel (PLAVIX) 75 MG tablet  Take 1 tablet (75 mg total) by mouth daily.   90 tablet   3   .  nitroGLYCERIN (NITROSTAT) 0.4 MG SL tablet  Place 0.4 mg under the tongue every 5 (five) minutes as needed. For chest pains up to 3 doses. If pain still persist call emergency personal.          .  simvastatin (ZOCOR) 40 MG tablet  Take 1 tablet (40 mg total) by mouth at bedtime.   90 tablet   3       Allergies   Allergen  Reactions   .  Penicillins  Hives     Review of Systems  Constitutional: Positive for unexpected weight change. Negative for fever, chills, appetite change and fatigue.  Eyes: Negative.   Respiratory: Positive for cough and shortness of breath.         Hemoptysis  Cardiovascular: Negative for chest pain and leg swelling.  Gastrointestinal: Positive for constipation.  Genitourinary: Positive for frequency. Negative for dysuria.  Musculoskeletal: Positive for arthralgias.  Skin: Negative.   Neurological: Positive for headaches.  Hematological: Negative.   Psychiatric/Behavioral: Negative.     BP 161/79  Pulse 69  Resp 16  Ht 5\' 6"  (1.676 m)  Wt 144 lb (65.318 kg)  BMI 23.24 kg/m2  SpO2 95% Physical Exam  Constitutional: She is oriented to person, place, and time. She appears well-developed and well-nourished.  HENT:   Head: Normocephalic and atraumatic.    Mouth/Throat: Oropharynx is clear and moist.  Eyes: Conjunctivae and EOM are normal. Pupils are equal, round, and reactive to light. No scleral icterus.  Neck: Normal range of motion. Neck supple. No JVD present. No tracheal deviation present. No thyromegaly present.  Cardiovascular: Normal rate, regular rhythm, normal heart sounds and intact distal pulses.  Exam reveals no gallop and no friction rub.    No murmur heard. Pulmonary/Chest: Effort normal and breath sounds normal. No respiratory distress. She has no rales.  Abdominal: Soft. Bowel sounds are normal. She exhibits no distension and no mass. There is no tenderness.  Musculoskeletal: She exhibits edema.  Lymphadenopathy:    She has no cervical adenopathy.  Neurological: She is alert and oriented to person, place, and time. She has normal strength. No cranial nerve deficit or sensory deficit.  Skin: Skin is warm and dry.  Psychiatric: She has a normal mood and affect.     Diagnostic Tests:  MRI HEAD WITHOUT AND WITH CONTRAST   Technique:  Multiplanar, multiecho pulse sequences of the brain and surrounding structures were obtained according to standard protocol without and with intravenous contrast   Contrast: 14mL MULTIHANCE GADOBENATE DIMEGLUMINE 529 MG/ML IV SOLN   Comparison: None.   Findings: There is no evidence for acute infarction, intracranial hemorrhage, mass lesion, hydrocephalus, or extra-axial fluid. Moderate atrophy, with extensive chronic microvascular ischemic change.  No midline shift.  Major intracranial vascular structures patent.  Post infusion, no abnormal intracranial enhancement.  Mild chronic sinus disease.  No mastoid fluid. Pituitary, cerebellar tonsils, and upper cervical region all appear unremarkable.  No visible skull base or calvarial lesions.   IMPRESSION:  Atrophy and small vessel disease. No visible metastatic disease.  No abnormal intracranial enhancement.   Original Report  Authenticated By: Elsie Stain, M.D. CT ANGIOGRAPHY CHEST    Technique: Multidetector CT imaging of the chest using the standard protocol  during bolus administration of intravenous contrast. Multiplanar reconstructed images including MIPs were obtained and reviewed to evaluate the vascular anatomy.   Contrast: 80mL OMNIPAQUE IOHEXOL 350 MG/ML IV SOLN   Comparison: Chest radiograph performed earlier today at 03:51 a.m.   Findings: There is no evidence of pulmonary embolus.   There is a multilobulated soft tissue mass noted at and below the right hilum, with extension peripherally along the right major fissure.  Given extension along the fissure, the mass measures approximately 10 cm in length, and approximately 2.9 x 2.3 cm near the right hilum.   The appearance raises concern for bronchogenic malignancy.  Some of the extensions appear to fill and expand distal bronchioles.  No additional masses are identified.  The focal density overlying the left lung base on prior chest radiograph is not well characterized on CT, but may have reflected costochondral calcification.   Mild bibasilar atelectasis is noted; minimal nodularity at the lung bases likely reflects atelectasis.  There is no evidence of pleural effusion or pneumothorax.   Scattered calcification is noted along the descending thoracic aorta.  Dense calcification is noted along the coronary arteries. The mediastinum is otherwise unremarkable in appearance.  Scattered mediastinal nodes remain borderline normal in size; no mediastinal lymphadenopathy is seen.  Though the mass arises at the right hilum, no separate hilar nodes are identified.   No pericardial effusion is identified.  The great vessels are grossly unremarkable in appearance.  No axillary lymphadenopathy is seen.  The thyroid gland is unremarkable in appearance.  Scattered clips are noted at both axilla.   The visualized portions of the liver and spleen  are unremarkable. A somewhat complex 3.3 cm mass is noted at the right adrenal gland. This contains a central focus of increased attenuation, and is nonspecific in appearance.  Scattered diverticulosis is noted along the visualized portions of the colon.   No acute osseous abnormalities are seen.   IMPRESSION:   1.  No evidence of pulmonary embolus. 2.  Multilobulated soft tissue mass at and below the right hilum, extending peripherally along the right major fissure.  Given extension along the fissure, the mass measures approximately 10 cm in length, and 2.9 x 2.3 cm near the right hilum.  This is concerning for bronchogenic malignancy; the mass appears to fill and expand distal bronchioles. 3.  Mild bibasilar atelectasis noted. 4.  Dense calcification along the coronary arteries. 5.  Somewhat complex 3.3 cm mass of the right adrenal gland, with a central focus of increased attenuation; this is nonspecific in appearance.  Metastatic disease cannot be entirely excluded. 6.  Scattered diverticulosis along the visualized portions of the colon.   These results were called by telephone on 01/17/2012  at  07:02 a.m. to  Dr. Eber Hong, who verbally acknowledged these results.   Original Report Authenticated By: Tonia Ghent, M.D.          NUCLEAR MEDICINE PET SKULL BASE TO THIGH    Fasting Blood Glucose:  64   Technique:  18.3 mCi F-18 FDG was injected intravenously. CT data was obtained and used for attenuation correction and anatomic localization only.  (This was not acquired as a diagnostic CT examination.) Additional exam technical data entered on technologist worksheet.   Comparison:  CT chest dated 01/17/2012   Findings:   Neck: No hypermetabolic lymph nodes in the neck.   Chest:  Branching opacity may reflect soft tissue/mucus plugging within a segmental right middle lobe bronchus.   Centrally, there is  a 2.9 x 2.5 cm nodular opacity with associated  mild hypermetabolism along its lateral/peripheral margin, max SUV 4.3. No hypermetabolic mediastinal or hilar nodes.   Abdomen/Pelvis:  No abnormal hypermetabolic activity within the liver, pancreas, adrenal glands, or spleen.  No hypermetabolic lymph nodes in the abdomen or pelvis.   [place]:  No focal hypermetabolic activity to suggest skeletal metastasis.   IMPRESSION: Branching soft tissue opacity along  a segmental right middle lobe bronchus, with central 2.9 x 2.5 cm nodular opacity.  Associated mild hypermetabolism along its lateral/peripheral margin, max SUV 4.3.   This appearance is indeterminate, and could still reflect a benign lesion with postobstructive infection/inflammation, but tumor is not excluded.  Consider bronchoscopy with tissue sampling for further characterization.   Original Report Authenticated By: Charline Bills, M.D.       External Result Report      Impression:  The patient has a 2.9 x 2.5 cm nodular opacity with mild hypermetabolism in the right middle lobe and was found to have a visible mass in the medial segmental bronchus of the right middle lobe with biopsy-proven carcinoid tumor. I agree that surgical resection with right middle lobectomy is the best treatment for this patient.Review of her pulmonary function tests shows an FEV1 of 2.17 which is 105% of predicted. Her FVC is 3.29 which is 112% predicted. Her adjusted diffusion capacity is 86% predicted. Cardiology consultation was reviewed and cardiology did not feel that any further workup or intervention was needed prior to proceeding ahead with her surgery. The patient discontinue her aspirin and Plavix last Tuesday.She is stable for right thoracotomy and right middle lobectomy tomorrow. I discussed the operative procedure again with the patient and her son. We discussed alternatives, benefits, and risks including but not limited to bleeding, blood transfusion, infection, bronchial stump  complications, possible need for resection of the upper or lower lobe if it is also involved with the tumor, respiratory failure, and she understands and agrees to proceed.  Plan: We will proceed with flexible fiberoptic bronchoscopy and right thoracotomy for a right middle lobectomy tomorrow.

## 2012-03-14 NOTE — Progress Notes (Signed)
Brief nursing progress:  Pt arrived from PACU stable with RN on monitor.  No distress, lungs rhonchious bilaterally, so SOB, no C/O pain at this time.  Patient moving all extremities, following commands, allert and oriented, drowsy.

## 2012-03-14 NOTE — Progress Notes (Signed)
Patient ID: [name], female   DOB: December 26, 1933, 78 y.o.   MRN: [id number] Filed Vitals:   03/14/12 1830 03/14/12 1845 03/14/12 1900 03/14/12 2000  BP:   128/58 137/57  Pulse: 73 72 70 72  Temp:    98 F (36.7 C)  TempSrc:    Oral  Resp: 17 16 17 23   SpO2: 96% 97% 97% 98%   Sleeping  CT output low Urine output ok Air leak from chest tubes

## 2012-03-14 NOTE — Anesthesia Postprocedure Evaluation (Signed)
  Anesthesia Post-op Note  Patient: [name]  Procedure(s) Performed: Procedure(s) (LRB): FLEXIBLE BRONCHOSCOPY (N/A) THOROCOTOMY WITH LOBECTOMY (Right)  Patient Location: PACU  Anesthesia Type: General  Level of Consciousness: awake and alert   Airway and Oxygen Therapy: Patient Spontanous Breathing and Patient connected to nasal cannula oxygen  Post-op Pain: mild  Post-op Assessment: Post-op Vital signs reviewed and Patient's Cardiovascular Status Stable  Post-op Vital Signs: stable  Complications: No apparent anesthesia complications

## 2012-03-14 NOTE — Transfer of Care (Signed)
Immediate Anesthesia Transfer of Care Note  Patient: [name]  Procedure(s) Performed: Procedure(s) (LRB): FLEXIBLE BRONCHOSCOPY (N/A) THOROCOTOMY WITH LOBECTOMY (Right)  Patient Location: PACU  Anesthesia Type: General  Level of Consciousness: awake, alert  and oriented  Airway & Oxygen Therapy: Patient Spontanous Breathing  Post-op Assessment: Report given to PACU RN  Post vital signs: stable  Complications: No apparent anesthesia complications

## 2012-03-14 NOTE — Addendum Note (Signed)
Addendum  created 03/14/12 1620 by Kipp Brood, MD   Modules edited:Orders

## 2012-03-14 NOTE — Interval H&P Note (Signed)
History and Physical Interval Note:  03/14/2012 8:49 AM  [name]  has presented today for surgery, with the diagnosis of Right lung mass  The various methods of treatment have been discussed with the patient and family. After consideration of risks, benefits and other options for treatment, the patient has consented to  Procedure(s) (LRB): FLEXIBLE BRONCHOSCOPY (N/A) THOROCOTOMY WITH LOBECTOMY (Right) as a surgical intervention .  The patients' history has been reviewed, patient examined, no change in status, stable for surgery.  I have reviewed the patients' chart and labs.  Questions were answered to the patient's satisfaction.     [name]

## 2012-03-14 NOTE — OR Nursing (Signed)
Start time for bronchoscopy 0912; end at 0914. Start for right thoracotomy/lobectomy at 0945.

## 2012-03-15 ENCOUNTER — Inpatient Hospital Stay (HOSPITAL_COMMUNITY): Payer: Medicare Other

## 2012-03-15 ENCOUNTER — Encounter (HOSPITAL_COMMUNITY): Payer: Self-pay | Admitting: Surgery

## 2012-03-15 LAB — CBC
HCT: 30.8 % — ABNORMAL LOW (ref 36.0–46.0)
MCHC: 33.1 g/dL (ref 30.0–36.0)
Platelets: 208 10*3/uL (ref 150–400)
RDW: 14.3 % (ref 11.5–15.5)
WBC: 13.9 10*3/uL — ABNORMAL HIGH (ref 4.0–10.5)

## 2012-03-15 LAB — POCT I-STAT 3, ART BLOOD GAS (G3+)
O2 Saturation: 98 %
pCO2 arterial: 36.7 mmHg (ref 35.0–45.0)
pH, Arterial: 7.458 — ABNORMAL HIGH (ref 7.350–7.400)
pO2, Arterial: 95 mmHg (ref 80.0–100.0)

## 2012-03-15 LAB — BASIC METABOLIC PANEL
BUN: 11 mg/dL (ref 6–23)
Chloride: 103 mEq/L (ref 96–112)
GFR calc Af Amer: >90 mL/min (ref >90)
GFR calc non Af Amer: 87 mL/min — ABNORMAL LOW (ref >90)
Potassium: 3.6 mEq/L (ref 3.5–5.1)
Sodium: 137 mEq/L (ref 135–145)

## 2012-03-15 LAB — GLUCOSE, CAPILLARY
Glucose-Capillary: 106 mg/dL — ABNORMAL HIGH (ref 70–99)
Glucose-Capillary: 118 mg/dL — ABNORMAL HIGH (ref 70–99)
Glucose-Capillary: 145 mg/dL — ABNORMAL HIGH (ref 70–99)
Glucose-Capillary: 146 mg/dL — ABNORMAL HIGH (ref 70–99)

## 2012-03-15 MED ORDER — POTASSIUM CHLORIDE 10 MEQ/50ML IV SOLN: 10.0000 meq | INTRAVENOUS | Status: AC

## 2012-03-15 MED ORDER — FUROSEMIDE 10 MG/ML IJ SOLN
40.0000 mg | Freq: Once | INTRAMUSCULAR | Status: AC
  Administered 2012-03-15: 40 mg via INTRAVENOUS
  Filled 2012-03-15: qty 4

## 2012-03-15 MED ORDER — METOCLOPRAMIDE HCL 5 MG/ML IJ SOLN
10.0000 mg | Freq: Three times a day (TID) | INTRAMUSCULAR | Status: DC
  Administered 2012-03-15 – 2012-03-20 (×15): 10 mg via INTRAVENOUS
  Filled 2012-03-15 (×18): qty 2

## 2012-03-15 NOTE — Progress Notes (Signed)
Status post right vaths for bilobectomy Breathing comfortably in chair Minimal chest tube drainage, positive air leak with cough only Stable day

## 2012-03-15 NOTE — Progress Notes (Signed)
1 Day Post-Op Procedure(s) (LRB): FLEXIBLE BRONCHOSCOPY (N/A) THOROCOTOMY WITH LOBECTOMY (Right) Subjective: No complaints  Objective: Vital signs in last 24 hours: Temp:  [96.8 F (36 C)-98.6 F (37 C)] 97.9 F (36.6 C) (05/16 1135) Pulse Rate:  [57-79] 73  (05/16 1300) Cardiac Rhythm:  [-] Normal sinus rhythm (05/16 1200) Resp:  [12-26] 17  (05/16 1300) BP: (113-188)/(50-90) 148/65 mmHg (05/16 1300) SpO2:  [96 %-100 %] 98 % (05/16 1300) Arterial Line BP: (140-198)/(51-99) 180/70 mmHg (05/16 0800) FiO2 (%):  [28 %] 28 % (05/16 0719) Weight:  [66 kg (145 lb 8.1 oz)] 66 kg (145 lb 8.1 oz) (05/16 0600)  Hemodynamic parameters for last 24 hours:    Intake/Output from previous day: 05/15 0701 - 05/16 0700 In: 4957 [P.O.:30; I.V.:4377; IV Piggyback:550] Out: 2790 [Urine:1740; Blood:500; Chest Tube:550] Intake/Output this shift: Total I/O In: 366.7 [I.V.:60.7; IV Piggyback:306] Out: 940 [Urine:850; Chest Tube:90]  General appearance: alert and cooperative Neurologic: intact Heart: regular rate and rhythm, S1, S2 normal, no murmur, click, rub or gallop Lungs: clear to auscultation bilaterally and audible air leak Extremities: extremities normal, atraumatic, no cyanosis or edema Wound: dressing dry. Chest tube has 4/7 air leak with inspiration. Lab Results:  Basename 03/15/12 0400 03/12/12 1411  WBC 13.9* 6.5  HGB 10.2* 11.6*  HCT 30.8* 34.2*  PLT 208 236   BMET:  Basename 03/15/12 0400 03/12/12 1411  NA 137 139  K 3.6 3.7  CL 103 104  CO2 26 28  GLUCOSE 114* 83  BUN 11 15  CREATININE 0.60 0.71  CALCIUM 8.6 9.6    PT/INR:  Basename 03/12/12 1411  LABPROT 14.4  INR 1.10   ABG    Component Value Date/Time   PHART 7.458* 03/15/2012 0405   HCO3 26.0* 03/15/2012 0405   TCO2 27 03/15/2012 0405   O2SAT 98.0 03/15/2012 0405   CBG (last 3)   Basename 03/15/12 1133 03/15/12 0746 03/15/12 0351  GLUCAP 107* 106* 118*   CXR:  Lungs clear.  Small inferior  space. Assessment/Plan: S/P Procedure(s) (LRB): FLEXIBLE BRONCHOSCOPY (N/A) THOROCOTOMY WITH LOBECTOMY (Right) Mobilize Diuresis Chest tubes to 10cm suction   LOS: 1 day    Jaena Brocato K 03/15/2012

## 2012-03-15 NOTE — Progress Notes (Signed)
Dr. Laneta Simmers paged concerning symptomatic bradycardia.  Orders received.

## 2012-03-16 ENCOUNTER — Inpatient Hospital Stay (HOSPITAL_COMMUNITY): Payer: Medicare Other

## 2012-03-16 LAB — COMPREHENSIVE METABOLIC PANEL
AST: 21 U/L (ref 0–37)
Albumin: 2.7 g/dL — ABNORMAL LOW (ref 3.5–5.2)
Alkaline Phosphatase: 54 U/L (ref 39–117)
BUN: 8 mg/dL (ref 6–23)
CO2: 32 mEq/L (ref 19–32)
Chloride: 97 mEq/L (ref 96–112)
GFR calc non Af Amer: 89 mL/min — ABNORMAL LOW (ref >90)
Potassium: 2.9 mEq/L — ABNORMAL LOW (ref 3.5–5.1)
Total Bilirubin: 0.2 mg/dL — ABNORMAL LOW (ref 0.3–1.2)

## 2012-03-16 LAB — FUNGUS CULTURE W SMEAR
Fungal Smear: NONE SEEN
Special Requests: NORMAL

## 2012-03-16 LAB — GLUCOSE, CAPILLARY
Glucose-Capillary: 123 mg/dL — ABNORMAL HIGH (ref 70–99)
Glucose-Capillary: 144 mg/dL — ABNORMAL HIGH (ref 70–99)
Glucose-Capillary: 114 mg/dL — ABNORMAL HIGH (ref 70–99)
Glucose-Capillary: 131 mg/dL — ABNORMAL HIGH (ref 70–99)
Glucose-Capillary: 121 mg/dL — ABNORMAL HIGH (ref 70–99)

## 2012-03-16 LAB — CBC
HCT: 31.1 % — ABNORMAL LOW (ref 36.0–46.0)
MCV: 88.9 fL (ref 78.0–100.0)
RBC: 3.5 MIL/uL — ABNORMAL LOW (ref 3.87–5.11)
RDW: 14.3 % (ref 11.5–15.5)
WBC: 11.4 10*3/uL — ABNORMAL HIGH (ref 4.0–10.5)

## 2012-03-16 MED ORDER — POTASSIUM CHLORIDE 10 MEQ/50ML IV SOLN
10.0000 meq | INTRAVENOUS | Status: AC | PRN
  Administered 2012-03-16 (×3): 10 meq via INTRAVENOUS

## 2012-03-16 MED ORDER — LEVALBUTEROL HCL 0.63 MG/3ML IN NEBU
0.6300 mg | INHALATION_SOLUTION | Freq: Three times a day (TID) | RESPIRATORY_TRACT | Status: DC
  Administered 2012-03-17 – 2012-03-19 (×9): 0.63 mg via RESPIRATORY_TRACT
  Filled 2012-03-16 (×13): qty 3

## 2012-03-16 NOTE — Progress Notes (Signed)
[facility] BRIEF SICU PROGRESS NOTE  2 Days Post-Op  S/P Procedure(s) (LRB): FLEXIBLE BRONCHOSCOPY (N/A) THOROCOTOMY WITH LOBECTOMY (Right)   Stable day O2 sats 94 % on O2 1 L/min  Plan: Continue current plan  Dela Sweeny H 03/16/2012 7:50 PM

## 2012-03-16 NOTE — Progress Notes (Signed)
2 Days Post-Op Procedure(s) (LRB): FLEXIBLE BRONCHOSCOPY (N/A) THOROCOTOMY WITH LOBECTOMY (Right) Subjective: nausea  Objective: Vital signs in last 24 hours: Temp:  [97.9 F (36.6 C)-98.7 F (37.1 C)] 98.7 F (37.1 C) (05/17 0740) Pulse Rate:  [66-88] 88  (05/17 0700) Cardiac Rhythm:  [-] Normal sinus rhythm (05/16 2100) Resp:  [12-21] 15  (05/17 0700) BP: (133-188)/(59-98) 134/98 mmHg (05/17 0700) SpO2:  [93 %-100 %] 97 % (05/17 0806) FiO2 (%):  [24 %-28 %] 24 % (05/17 0806) Weight:  [61.6 kg (135 lb 12.9 oz)] 61.6 kg (135 lb 12.9 oz) (05/17 0500)  Hemodynamic parameters for last 24 hours:    Intake/Output from previous day: 05/16 0701 - 05/17 0700 In: 1721.3 [P.O.:400; I.V.:905.3; IV Piggyback:416] Out: 4690 [Urine:4250; Chest Tube:440] Intake/Output this shift:    General appearance: alert and cooperative Heart: regular rate and rhythm, S1, S2 normal, no murmur, click, rub or gallop Lungs: clear to auscultation bilaterally Wound: dressing dry small air leak from chest tubes  Lab Results:  Basename 03/16/12 0415 03/15/12 0400  WBC 11.4* 13.9*  HGB 10.4* 10.2*  HCT 31.1* 30.8*  PLT 196 208   BMET:  Basename 03/16/12 0415 03/15/12 0400  NA 136 137  K 2.9* 3.6  CL 97 103  CO2 32 26  GLUCOSE 117* 114*  BUN 8 11  CREATININE 0.56 0.60  CALCIUM 8.6 8.6    PT/INR: No results found for this basename: LABPROT,INR in the last 72 hours ABG    Component Value Date/Time   PHART 7.458* 03/15/2012 0405   HCO3 26.0* 03/15/2012 0405   TCO2 27 03/15/2012 0405   O2SAT 98.0 03/15/2012 0405   CBG (last 3)   Basename 03/16/12 0739 03/16/12 0345 03/15/12 2350  GLUCAP 144* 123* 129*   CXR:  Lungs clear. Small right apical and basilar space Assessment/Plan: S/P Procedure(s) (LRB): FLEXIBLE BRONCHOSCOPY (N/A) THOROCOTOMY WITH LOBECTOMY (Right) Will put chest tubes to water seal and repeat cxr at noon to be sure lung remains inflated. Continue IS.   LOS: 2 days     Evelene Croon K 03/16/2012

## 2012-03-16 NOTE — Op Note (Signed)
[name], FLEER NO.:  [id number]  MEDICAL RECORD NO.:  [id number]  LOCATION:  2302                         FACILITY:  [facility]  PHYSICIAN:  Evelene Croon, M.D.     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  OPERATIVE PROCEDURE:  Flexible fiberoptic bronchoscopy, right thoracotomy with right middle and lower lobectomy and mediastinal lymph node dissection.  ATTENDING SURGEON:  Evelene Croon, MD  ASSISTANT:  Coral Ceo, PA-C  ANESTHESIA:  General endotracheal.  CLINICAL HISTORY:  This patient is a 78 year old previous smoker who presented to the emergency room earlier in the month for an episode of hemoptysis that was initially dark in color and then bright red.  She had an abnormal chest x-ray showing a right hilar lung mass and CT scan showed a 2.9-cm multilobulated soft tissue mass below the right hilum with extension peripherally along the right major fissure.  CT scan also showed a 3.3-cm mass in the right adrenal gland with a central focus of increased attenuation that was nonspecific in appearance.  The PET scan showed mild hypometabolism along the lateral margin of this chest lesion with a maximum SUV of 4.3.  There were no hypermetabolic mediastinal or hilar lymph nodes.  There was no other significant hypermetabolic uptake.  She subsequently underwent bronchoscopy by Dr. Shelle Iron, which showed a visible mass in the medial segment of the right middle lobe bronchus and biopsies showed carcinoid.  After I saw her in the office and reviewed her scans, I felt the surgical treatment for her right middle lobe carcinoid was the best treatment.  Preoperative pulmonary function testing showed no significant lung disease.  She had been  seen by Cardiology and cleared for surgery.  I discussed the operative procedure with her and her family including alternatives, benefits, and risks including, but not limited to bleeding, blood transfusion, infection, bronchial stump complications, prolonged air leak, respiratory failure, possibility that we may need to remove either the upper or lower lobe along with the middle lobe to completely remove the lesion, and she understood all of these and agreed to proceed.  OPERATIVE PROCEDURE:  The patient was taken to the operating room after being seen in the holding area.  In the holding area, I reviewed her CT scans and confirmed that the right side was the correct operative side. The right-sided chest was signed by me.  She was given preoperative intravenous antibiotics.  She was taken back to the operating room and placed on the table in supine position.  After induction of general endotracheal anesthesia using a double-lumen artery using a single-lumen tube, flexible fiberoptic bronchoscopy was performed.  This showed the trachea was normal.  The carina was sharp.  The left bronchial tree had normal segmental anatomy without endobronchial lesions or extrinsic compression.  The right bronchial tree also had normal segmental anatomy.  The carcinoid tumor was visible at the opening of the medial segmental bronchus of the middle lobe.  There were  no other lesions seen and no signs of extrinsic compression.  The bronchoscope was then withdrawn from the patient.  The single-lumen tube was converted to a double-lumen tube.  Foley catheter was placed in the bladder using sterile technique.  Lower extremity sequential compression devices were used.  The patient was then turned in the left lateral decubitus position with the right side up.  Then, the right chest was opened through a lateral muscle-sparing thoracotomy incision.  Pleural space was entered through the fifth intercostal space.   Chest retractor was placed.  Examination of the pleural space showed no parietal pleural lesions.  The mass was easily palpable in the right middle lobe.  Then, the major fissure was opened and the interlobar portion of the pulmonary artery was identified.  The right middle lobe branches were identified. The carcinoid tumor was lying immediately adjacent to these vessels as they came off the interlobar pulmonary artery and appeared somewhat adherent.  I was able to dissect off the tumor from the pulmonary artery and the lower branches.  Two middle lobe branches were then suture ligated, flushed with the interlobar pulmonary artery and ligated distally and divided.  Then, the lung was retracted laterally and the right superior pulmonary vein was identified.  The branch to the middle lobe was identified and was divided using a vascular stapler.  Then, the right middle lobe bronchus was identified anteriorly and encircled with a tape.  The right middle lobe really did not have much of a fissure dividing it from the upper lobe.  Judging the course of the middle lobe vessels, I was able to determine the correct plane for division of the middle lobe from the upper lobe.  After dividing this, the right middle lobe bronchus was divided, flushed with the bronchus intermedius using a TA-30 linear stapler.  The stapler was closed and then the right lung was reinflated and the upper and lower lobes were inflated normally. The stapler was fired and the bronchus was divided distal to the stapler.  Examination of the bronchus in the operating room look like there was tumor present right at the resection margin.  This specimen was sent to pathology for examination and the pathologist said that there was tumor present at the margin by frozen section.  Since I divided this bronchus, flushed with the bronchus intermedius, I felt the only other good option was to perform a right lower lobectomy and  divide the bronchus intermedius just beyond the takeoff of the upper lobe bronchus.  I felt that this would surely give me a negative bronchial margin.  Then, the interlobar pulmonary artery was divided just below the posterior ascending branch to the upper lobe.  This was done using a vascular stapler.  The right lower pulmonary vein was encircled and divided using a vascular stapler.  Then, the bronchus intermedius was identified and followed proximally to the takeoff of the right upper lobe bronchus.  The bronchus intermedius was encircled with a stapler just beyond the takeoff of the right upper lobe bronchus and the stapler was closed.  The lung was inflated and the right upper lobe continued to inflate normally.  The stapler was fired and the bronchus was divided distal to the stapler.  Examination of the bronchial margin in the operating room grossly showed no tumor, it was sent to pathology.  A frozen section was called back to the operating room said the bronchial stump margin of resection was negative.  There was complete hemostasis. I  use some ProGel on the staple lines where the middle lobe was divided from the upper lobe and where the upper lobe was also divided from the lower lobe posteriorly.  The bronchial stump was tested under saline solution under pressure of 30 cm of water and there were no air leak seen from the bronchial stump.  There were a few bubbles coming from the staple line of the lung itself.  This was fairly close to the hilum and I did not feel that putting any further sutures in there would correct this.  Some additional ProGel was applied to this area.  Then, the mediastinal lymph node dissection was performed.  There were quite a number of lymph nodes present along the bronchi.  Mostly these were taken with the specimen.  There were large lymph nodes in the subcarinal region, which were excised along with the specimen.  I did examine the right lower  paratracheal region.  I did not see any visible lymph nodes there.  Then, an On-Q catheter was brought through a separate stab incision and positioned in a subpleural location posterior to the thoracotomy incision.  It was connected to a pinball with 0.5% Marcaine local anesthesia.  Then two chest tubes were placed with 28-French straight tube anteriorly and the 32-French Blake drain positioned posteriorly and at the bases.  The ribs were then reapproximated with #2 Vicryl pericostal sutures.  The lung was reinflated before closing the chest.  The muscles were then reapproximated in their normal position and the subcutaneous tissue was closed with continuous 2-0 Vicryl suture.  The skin was closed with a 3-0 Vicryl subcuticular closure.  The sponge, needle, and instrument counts were correct according to the scrub nurse.  The patient was then turned in supine position, extubated, and transferred to the postanesthesia care unit in satisfactory and stable condition.     Evelene Croon, M.D.     BB/MEDQ  D:  03/15/2012  T:  03/16/2012  Job:  [id number]

## 2012-03-17 ENCOUNTER — Inpatient Hospital Stay (HOSPITAL_COMMUNITY): Payer: Medicare Other

## 2012-03-17 LAB — CULTURE, ROUTINE-ABSCESS
Culture: NO GROWTH
Gram Stain: NONE SEEN

## 2012-03-17 LAB — BASIC METABOLIC PANEL
Calcium: 9 mg/dL (ref 8.4–10.5)
GFR calc non Af Amer: 89 mL/min — ABNORMAL LOW (ref >90)
Sodium: 139 mEq/L (ref 135–145)

## 2012-03-17 LAB — CBC
Platelets: 201 10*3/uL (ref 150–400)
RBC: 3.66 MIL/uL — ABNORMAL LOW (ref 3.87–5.11)
WBC: 10 10*3/uL (ref 4.0–10.5)

## 2012-03-17 LAB — GLUCOSE, CAPILLARY
Glucose-Capillary: 117 mg/dL — ABNORMAL HIGH (ref 70–99)
Glucose-Capillary: 133 mg/dL — ABNORMAL HIGH (ref 70–99)

## 2012-03-17 MED ORDER — POTASSIUM CHLORIDE 10 MEQ/50ML IV SOLN
10.0000 meq | INTRAVENOUS | Status: DC | PRN
Start: 2012-03-17 — End: 2012-03-20
  Administered 2012-03-17: 10 meq via INTRAVENOUS
  Filled 2012-03-17 (×2): qty 50

## 2012-03-17 NOTE — Progress Notes (Signed)
   CARDIOTHORACIC SURGERY PROGRESS NOTE  3 Days Post-Op  S/P Procedure(s) (LRB): FLEXIBLE BRONCHOSCOPY (N/A) THOROCOTOMY WITH LOBECTOMY (Right)  Subjective: No complaints.  Adequate analgesia.  No shortness of breath.  Objective: Vital signs in last 24 hours: Temp:  [97.9 F (36.6 C)-99.1 F (37.3 C)] 97.9 F (36.6 C) (05/18 1222) Pulse Rate:  [65-85] 81  (05/18 0700) Cardiac Rhythm:  [-] Normal sinus rhythm (05/18 0700) Resp:  [11-19] 18  (05/18 0700) BP: (132-194)/(61-116) 177/116 mmHg (05/18 0700) SpO2:  [94 %-99 %] 99 % (05/18 1441) Weight:  [63.4 kg (139 lb 12.4 oz)] 63.4 kg (139 lb 12.4 oz) (05/18 0600)  Physical Exam:  Rhythm:   sinus  Breath sounds: clear  Heart sounds:  RRR  Incisions:  Dressings dry  Abdomen:  soft  Extremities:  warm  Chest tube(s):  + small air leak   Intake/Output from previous day: 05/17 0701 - 05/18 0700 In: 1960.7 [P.O.:300; I.V.:1200.7; IV Piggyback:460] Out: 1250 [Urine:275; Chest Tube:975] Intake/Output this shift:    Lab Results:  Basename 03/17/12 0426 03/16/12 0415  WBC 10.0 11.4*  HGB 10.8* 10.4*  HCT 33.0* 31.1*  PLT 201 196   BMET:  Basename 03/17/12 0426 03/16/12 0415  NA 139 136  K 3.4* 2.9*  CL 101 97  CO2 28 32  GLUCOSE 118* 117*  BUN 9 8  CREATININE 0.55 0.56  CALCIUM 9.0 8.6    CBG (last 3)   Basename 03/17/12 1141 03/17/12 0718 03/17/12 0400  GLUCAP 112* 133* 117*    CXR:  Looks good  Assessment/Plan: S/P Procedure(s) (LRB): FLEXIBLE BRONCHOSCOPY (N/A) THOROCOTOMY WITH LOBECTOMY (Right)  Will try chest tubes on water seal Mobilize PT Transfer step down  Eriyanna Kofoed H 03/17/2012 2:46 PM

## 2012-03-18 ENCOUNTER — Inpatient Hospital Stay (HOSPITAL_COMMUNITY): Payer: Medicare Other

## 2012-03-18 LAB — GLUCOSE, CAPILLARY: Glucose-Capillary: 155 mg/dL — ABNORMAL HIGH (ref 70–99)

## 2012-03-18 MED ORDER — LACTULOSE 10 GM/15ML PO SOLN
20.0000 g | Freq: Once | ORAL | Status: AC
  Administered 2012-03-18: 20 g via ORAL
  Filled 2012-03-18: qty 30

## 2012-03-18 MED ORDER — SODIUM CHLORIDE 0.9 % IJ SOLN
INTRAMUSCULAR | Status: AC
  Administered 2012-03-18: 09:00:00
  Filled 2012-03-18: qty 10

## 2012-03-18 NOTE — Progress Notes (Addendum)
4 Days Post-Op Procedure(s) (LRB): FLEXIBLE BRONCHOSCOPY (N/A) THOROCOTOMY WITH LOBECTOMY (Right)  Subjective: Patient with constipation.  Objective: Vital signs in last 24 hours: Patient Vitals for the past 24 hrs:  BP Temp Temp src Pulse Resp SpO2  03/18/12 0900 - - - - - 96 %  03/18/12 0836 - - - - 30  96 %  03/18/12 0751 176/86 mmHg 98.2 F (36.8 C) Oral 74  16  95 %  03/18/12 0400 - - - 67  10  95 %  03/18/12 0330 - - - - 13  95 %  03/18/12 0329 - - - - 13  95 %  03/18/12 0300 169/79 mmHg 98.4 F (36.9 C) Oral 73  13  95 %  03/18/12 0000 - - - 71  13  96 %  03/17/12 2343 - - - - 13  96 %  03/17/12 2300 123/72 mmHg 98.4 F (36.9 C) Oral 71  12  96 %  03/17/12 2040 183/93 mmHg 98.9 F (37.2 C) Oral 80  15  95 %  03/17/12 1946 - - - - - 95 %  03/17/12 1600 - 98.2 F (36.8 C) Oral - 20  97 %  03/17/12 1500 131/59 mmHg - - 66  13  96 %  03/17/12 1441 - - - - - 99 %  03/17/12 1400 162/78 mmHg - - 69  13  96 %  03/17/12 1300 181/87 mmHg - - 76  15  97 %  03/17/12 1222 - 97.9 F (36.6 C) Oral - - -  03/17/12 1200 178/81 mmHg - - 73  13  97 %    Current Weight  03/17/12 139 lb 12.4 oz (63.4 kg)      Intake/Output from previous day: 05/18 0701 - 05/19 0700 In: 1370 [P.O.:840; I.V.:530] Out: 900 [Urine:900]   Physical Exam:  Cardiovascular: RRR, no murmurs, gallops, or rubs. Pulmonary: Diminished at bases R>L; no rales, wheezes, or rhonchi. Abdomen: Soft, non tender, bowel sounds present. Extremities: No lower extremity edema. Wounds: Dressing is clean and dry.   Lab Results: CBC: Basename 03/17/12 0426 03/16/12 0415  WBC 10.0 11.4*  HGB 10.8* 10.4*  HCT 33.0* 31.1*  PLT 201 196   BMET:  Basename 03/17/12 0426 03/16/12 0415  NA 139 136  K 3.4* 2.9*  CL 101 97  CO2 28 32  GLUCOSE 118* 117*  BUN 9 8  CREATININE 0.55 0.56  CALCIUM 9.0 8.6    PT/INR: No results found for this basename: LABPROT,INR in the last 72 hours ABG:  INR: Will add last  result for INR, ABG once components are confirmed Will add last 4 CBG results once components are confirmed  Assessment/Plan:  1. CV - Hypertension.On pre op dose of Coreg 6.25 bid. 2.  Pulmonary - Encourage incentive spirometer.Chest tube is to water seal. There is a +1-2 air leak with cough. CXR this am shows right pneumothorax perhaps slightly increased.Chest tubes to remain for now.Check CXR in am. 3.  Acute blood loss anemia - Last H/H stable at 10.8/33. 4.LOC constipation. 5.CBGs 125/110/155.No history of diabetes.Will stop glucose checks and SS PRN.   [name],Shawna MPA-C 03/18/2012   I have seen and examined the patient and agree with the assessment and plan as outlined.  I think [name] looks good.  I do not see an air leak but suspect intrathoracic space.  Slight increase size of PTX on CXR, so will not d/c any tubes today but possibly can remove one  or both tubes tomorrow.  Purcell Nails 03/18/2012 12:37 PM

## 2012-03-19 ENCOUNTER — Inpatient Hospital Stay (HOSPITAL_COMMUNITY): Payer: Medicare Other

## 2012-03-19 LAB — GLUCOSE, CAPILLARY: Glucose-Capillary: 116 mg/dL — ABNORMAL HIGH (ref 70–99)

## 2012-03-19 NOTE — Progress Notes (Addendum)
                    [address]            [place], [address]          [phone]     5 Days Post-Op Procedure(s) (LRB): FLEXIBLE BRONCHOSCOPY (N/A) THOROCOTOMY WITH LOBECTOMY (Right)  Subjective: Stable night.  Walked in hall yesterday, +BM.  Still not eating well. Breathing stable.  Objective: Vital signs in last 24 hours: Patient Vitals for the past 24 hrs:  BP Temp Temp src Pulse Resp SpO2  03/19/12 0355 156/67 mmHg 97.8 F (36.6 C) Oral 72  18  98 %  03/18/12 2346 - - - - 19  94 %  03/18/12 2330 153/76 mmHg 98.5 F (36.9 C) Oral 82  13  96 %  03/18/12 2022 164/86 mmHg 98.2 F (36.8 C) Oral 86  18  95 %  03/18/12 2021 - - - - - 96 %  03/18/12 2000 - - - - 10  94 %  03/18/12 1554 174/92 mmHg 98.3 F (36.8 C) Oral - - -  03/18/12 1454 - - - - - 96 %  03/18/12 1331 - - - - 16  96 %  03/18/12 1214 177/81 mmHg 97.9 F (36.6 C) Oral - - -  03/18/12 0900 - - - - - 96 %  03/18/12 0836 - - - - 30  96 %  03/18/12 0751 176/86 mmHg 98.2 F (36.8 C) Oral 74  16  95 %   Current Weight  03/17/12 139 lb 12.4 oz (63.4 kg)     Intake/Output from previous day: 05/19 0701 - 05/20 0700 In: 460 [I.V.:210; IV Piggyback:250] Out: 925 [Urine:700; Chest Tube:225]    PHYSICAL EXAM:  Heart: RRR Lungs: clear Wound: clean and dry Chest tube: intermittent 1/7 air leak with cough   Lab Results: CBC: Basename 03/17/12 0426  WBC 10.0  HGB 10.8*  HCT 33.0*  PLT 201   BMET:  Basename 03/17/12 0426  NA 139  K 3.4*  CL 101  CO2 28  GLUCOSE 118*  BUN 9  CREATININE 0.55  CALCIUM 9.0    CXR: stable R sided ptx/space  Assessment/Plan: S/P Procedure(s) (LRB): FLEXIBLE BRONCHOSCOPY (N/A) THOROCOTOMY WITH LOBECTOMY (Right) CXR stable.  Hopefully can d/c 1 CT today. Continue CTs to H2O seal for now. HTN- BPs elevated, back on home meds.  Watch. Hypokalemia- replace K+ per protocol. Ambulate, continue pulm toilet.   LOS: 5 days    COLLINS,GINA  H 03/19/2012    Chart reviewed, patient examined, agree with above. She still has a small air leak with cough.  CXR shows a space on the right which is not surprising after bilobectomy.  Will remove one of chest tubes and keep the other in for now.

## 2012-03-20 ENCOUNTER — Inpatient Hospital Stay (HOSPITAL_COMMUNITY): Payer: Medicare Other

## 2012-03-20 LAB — GLUCOSE, CAPILLARY
Glucose-Capillary: 121 mg/dL — ABNORMAL HIGH (ref 70–99)
Glucose-Capillary: 175 mg/dL — ABNORMAL HIGH (ref 70–99)

## 2012-03-20 MED ORDER — OXYCODONE-ACETAMINOPHEN 5-325 MG PO TABS: 1.0000 | ORAL_TABLET | ORAL | Status: AC | PRN

## 2012-03-20 MED ORDER — LEVALBUTEROL HCL 0.63 MG/3ML IN NEBU
0.6300 mg | INHALATION_SOLUTION | Freq: Four times a day (QID) | RESPIRATORY_TRACT | Status: DC | PRN
  Filled 2012-03-20: qty 3

## 2012-03-20 NOTE — Progress Notes (Addendum)
                    [address]            [place] [address]          [phone]     6 Days Post-Op Procedure(s) (LRB): FLEXIBLE BRONCHOSCOPY (N/A) THOROCOTOMY WITH LOBECTOMY (Right)  Subjective: Feels well, no complaints.  Breathing stable off O2.  Objective: Vital signs in last 24 hours: Patient Vitals for the past 24 hrs:  BP Temp Temp src Pulse Resp SpO2  03/20/12 0743 - 97.7 F (36.5 C) Oral - - -  03/20/12 0400 164/74 mmHg 98.4 F (36.9 C) Oral 72  12  98 %  03/19/12 2348 150/66 mmHg 99.4 F (37.4 C) Oral 76  12  99 %  03/19/12 2002 - - - - - 99 %  03/19/12 2001 177/79 mmHg 97.7 F (36.5 C) Oral 75  17  99 %  03/19/12 2000 - - - - 12  98 %  03/19/12 1617 167/72 mmHg 98.1 F (36.7 C) Oral 68  11  96 %  03/19/12 1600 - - - - 13  97 %  03/19/12 1527 - - - - - 97 %  03/19/12 1159 177/79 mmHg 98.2 F (36.8 C) Oral 70  14  95 %  03/19/12 1158 - - - - 14  95 %  03/19/12 0845 - - - - - 96 %  03/19/12 0751 176/82 mmHg 97.6 F (36.4 C) Oral 72  16  96 %   Current Weight  03/17/12 139 lb 12.4 oz (63.4 kg)     Intake/Output from previous day: 05/20 0701 - 05/21 0700 In: 480 [I.V.:480] Out: 725 [Urine:700; Chest Tube:25]    PHYSICAL EXAM:  Heart: RRR Lungs: diminished BS on R Wound: clean and dry Chest tube: no air leak noted   CXR: stable R ptx/space  Assessment/Plan: S/P Procedure(s) (LRB): FLEXIBLE BRONCHOSCOPY (N/A) THOROCOTOMY WITH LOBECTOMY (Right) Possibly d/c remaining CT today. Continue pulm toilet, ambulation.   LOS: 6 days    COLLINS,GINA H 03/20/2012    Chart reviewed, patient examined, agree with above. No air leak from chest tube.  The right lung is clear.  There is a space and this will fill up with fluid. Will DC chest tube today. Transfer to 2000.

## 2012-03-20 NOTE — Care Management Note (Signed)
  Page 1 of 1   03/20/2012     10:20:21 AM   CARE MANAGEMENT NOTE 03/20/2012  Patient:  [name], [name]   Account Number:  [id number]  Date Initiated:  03/20/2012  Documentation initiated by:  Donn Pierini  Subjective/Objective Assessment:   Pt admitted s/p thoractomy with lobectomy     Action/Plan:   PTA pt lived at home with children, was independent with ADLs   Anticipated DC Date:  03/22/2012   Anticipated DC Plan:  HOME/SELF CARE      DC Planning Services  CM consult      Choice offered to / List presented to:             Status of service:  In process, will continue to follow Medicare Important Message given?   (If response is "NO", the following Medicare IM given date fields will be blank) Date Medicare IM given:   Date Additional Medicare IM given:    Discharge Disposition:    Per UR Regulation:  Reviewed for med. necessity/level of care/duration of stay  If discussed at Long Length of Stay Meetings, dates discussed:    Comments:  PCP- Vandyke  03/20/12- 1014- Donn Pierini RN, BSN [phone] Spoke with pt at bedside- per conversation pt lives at home alone, was independent with ADLs, does not use any DME at home. Pt plans to go home with son at discharge to stay short term. Pt states that she uses a local pharmacy for medications and has prescription benefits. NCM to continue to follow.

## 2012-03-20 NOTE — Progress Notes (Signed)
Utilization review completed.  

## 2012-03-20 NOTE — Discharge Instructions (Signed)
Lung Resection Care After Refer to this sheet in the next few weeks. These instructions provide you with information on caring for yourself after your procedure. Your caregiver may also give you more specific instructions. Your treatment has been planned according to current medical practices, but problems sometimes occur. Call your caregiver if you have any problems or questions after your procedure. HOME CARE INSTRUCTIONS  You may resume a normal diet and activities as directed.   Do not smoke or use tobacco products.   Change your bandages (dressings) as directed.   Only take over-the-counter or prescription medicines for pain, discomfort, or fever as directed by your caregiver.   Keep all follow-up appointments as directed.   Try to breathe deeply and cough as directed. Holding a pillow firmly over your ribs may help with discomfort.   If you were given an incentive spirometer in the hospital, continue to use it as directed.   Walk as directed by your caregiver.   You may take a shower and gently wash the area of your surgical cut (incision) with water and soap as directed. Do not use anything else to clean your incision except as directed by your caregiver. Do not take baths or sit in a hot tub.  SEEK MEDICAL CARE IF:  You notice redness, swelling, or increasing pain in the incision.   You are bleeding from the incision.   You see pus coming from the incision.   You notice a bad smell coming from the incision or dressing.   Your incision breaks open.   You cough up blood or pus, or you develop a cough that produces bad smelling sputum.   You have pain or swelling in your legs.   You have increasing pain that is not controlled with medicine.   You have trouble managing any of the tubes that have been left in place after surgery.  SEEK IMMEDIATE MEDICAL CARE IF:   You have a fever or chills.   You have any reaction or side effects to medicines given.   You have chest  pain or an irregular or rapid heartbeat.   You have dizzy episodes or fainting.   You have shortness of breath or difficulty breathing.   You have persistent nausea or vomiting.   You have a rash.  MAKE SURE YOU:  Understand these instructions.   Will watch your condition.   Will get help right away if you are not doing well or get worse.  Document Released: 05/06/2005 Document Revised: 10/06/2011 Document Reviewed: 06/16/2011 [facility] Patient Information 2012 [place], [place].

## 2012-03-21 ENCOUNTER — Encounter (HOSPITAL_COMMUNITY): Payer: Self-pay

## 2012-03-21 ENCOUNTER — Inpatient Hospital Stay (HOSPITAL_COMMUNITY): Payer: Medicare Other

## 2012-03-21 DIAGNOSIS — Z902 Acquired absence of lung [part of]: Secondary | ICD-10-CM

## 2012-03-21 DIAGNOSIS — Z9889 Other specified postprocedural states: Secondary | ICD-10-CM

## 2012-03-21 LAB — POCT I-STAT 4, (NA,K, GLUC, HGB,HCT)
Hemoglobin: 8.8 g/dL — ABNORMAL LOW (ref 12.0–15.0)
Sodium: 139 mEq/L (ref 135–145)

## 2012-03-21 NOTE — Progress Notes (Signed)
Patient [name], 78 year old white female resident of [place], [place], is a native of [place], [place].  Patient enjoys the spiritual support of her 2 sons and her granddaughter who is a Runner, broadcasting/film/video.  She looks forward to returning to her son's home in [place].  Patient thanked Orthoptist for providing pastoral presence, prayer, and conversation.  I will follow-up as needed.

## 2012-03-21 NOTE — Discharge Summary (Addendum)
Physician Discharge Summary  Patient ID: [name] MRN: [id number] DOB/AGE: 03-Dec-1933 78 y.o.  Admit date: 03/14/2012 Discharge date: 03/23/2012  Admission Diagnoses:  Patient Active Problem List  Diagnoses  . Pure hypercholesterolemia  . Lung mass  . carcinoid tumor of lung  . CAD (coronary artery disease)  . HTN (hypertension)  . Hyperlipidemia  . S/P thoracotomy  . S/P lobectomy of lung   Discharge Diagnoses:   Patient Active Problem List  Diagnoses  . Pure hypercholesterolemia  . Lung mass  . carcinoid tumor of lung  . CAD (coronary artery disease)  . HTN (hypertension)  . Hyperlipidemia  . S/P thoracotomy  . S/P lobectomy of lung   Discharged Condition: good  History of Present Illness:   Ms. [name] is a 78 yo white female with long standing smoking history who presented to the Emergency Department 01/2012 with an episode of hemoptysis.  The patient stated it initially was very dark, however it progressed to being bright red.  She underwent chest radiography which revealed a right hilar mass.  Due to this finding further workup consisted of CT scan which revealed a 2.9cm multilobulated soft tissue mass below the right hilum with extension peripherally along the right major fissure.  It also showed a 3.3 cm mass in the right adrenal gland with a central focus of increased attentuation.  The patient followed up with Dr. Shelle Iron who recommended she undergo PET Scan which showed mild hypermetabolic uptake.  Due to her symptoms and imaging results Dr. Shelle Iron performed a Bronchoscopy with biopsy on 02/14/2012 which confirmed the presence of carcinoid tumor.  Therefore she was referred for possible surgical resection.  Dr. Shelle Iron ordered the necessary preoperative testing.   The patient presented to [facility] on 02/21/2012 at which time Dr. Laneta Simmers evaluated the patient and reviewed the previous imaging studies.  He felt the patient would benefit from surgical resection of the right  middle lobe.  The risks and benefits of the procedure were explained to the patient and she was willing to proceed with the procedure.  The patient does have a recent history of CAD, S/P stenting.  Therefore, it was felt she undergo clearance by cardiology prior to proceeding with the procedure.  She was evaluated by Nicolasa Ducking, NP who felt being the patient was chest pain free, she would be deemed safe to proceed with surgery.  Tentatively planned for Mar 14, 2012.    Hospital Course:   Ms. [name] presented to [facility] on 03/14/2012.  She was taken to the operating room and underwent Flexible fiberoptic bronchoscopy, right sided thoracotomy with right middle and lower lobectomy with mediastinal lymph node resection.  The patient tolerated the procedure well and was extubated prior to leaving the operating room.  She was taken to the PACU in stable condition.  POD #0 patients chest tube with minimal output.  Air leak present.  POD #1 the patients chest tubes remain in place.  Placed on -10cm suction.  Air leak positive with cough.  POD #2 the patients chest tubes were placed on water seal.  Chest xray was performed which showed enlargement of the patients right sided pneumothorax.  Chest tubes were placed back on suction.  POD #3 the chest tubes were again placed on water seal.  The patient was medically stable and transferred to the step down unit.  POD #4 the patient experience hypertension.  She was placed on her home Coreg.  Her chest tubes remained on water  seal with slight increase in pneumothorax.  POD #5 the patients chest xray revealed small air space.  One of her chest tubes was removed.  The remaining chest tube remained on water seal.  POD #6 the patients chest xray did not reveal any evidence of pneumothorax.  Her final chest tube was removed without difficulty.  She was transferred to the telemetry unit in stable condition.  POD #7 patients chest xray revealed an  increase in a right sided hydropneumothorax, which is suspected after bilobectomy.  The patient does complain of some pain along her right side, which is controlled with pain medication.  She is doing very well.  Should she continue on this course and no further problems arise she will be discharged home tomorrow 03/22/2012.  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  The patient is to present to [facility] on 03/27/2012 to have her sutures removed by the nurse at 10:00.  The patient is to follow up with Dr. Laneta Simmers on 04/02/2012 at 1:30.  She should also follow up with Dr. Shelle Iron as well.    Disposition: 01-Home or Self Care  Discharge Orders    Future Appointments: Provider: Department: Dept Phone: Center:   03/27/2012 10:00 AM [facility] [phone] [facility]   04/02/2012 1:45 PM [facility] [phone] [facility]   04/11/2012 2:30 PM Tonny Bollman, MD [facility] [phone] [facility]   04/13/2012 10:00 AM Samul Dada, MD [facility] [phone] None     Medication List  As of 03/23/2012  8:14 AM   STOP taking these medications         benzonatate 100 MG capsule         TAKE these medications         aspirin 81 MG tablet   Take 81 mg by mouth daily.      carvedilol 6.25 MG tablet   Commonly known as: COREG   Take 1 tablet (6.25 mg total) by mouth 2 (two) times daily with a meal.      clopidogrel 75 MG tablet   Commonly known as: PLAVIX   Take 1 tablet (75 mg total) by mouth daily.      nitroGLYCERIN 0.4 MG SL tablet   Commonly known as: NITROSTAT   Place 0.4 mg under the tongue every 5 (five) minutes as needed. For chest pains up to 3 doses. If pain still persist call emergency personal.      oxyCODONE-acetaminophen 5-325 MG per tablet   Commonly known as: PERCOCET   Take 1-2 tablets by mouth every 4 (four) hours as needed.      simvastatin 40 MG tablet    Commonly known as: ZOCOR   Take 1 tablet (40 mg total) by mouth at bedtime.           Follow-up Information    Follow up with [name], MD. (see PA on 04/02/12 at 1;30 pm. Obtain Chest xray at [facility] imaging( in same office complex) at 12:30)    Contact information:   [address] [place] [address] [phone]       Follow up with [facility] NURSE. (03/27/12 at 10:00 am for suture removal, nurse at Dr Sharee Pimple office)       Follow up with Barbaraann Share, MD. Schedule an appointment as soon as possible for a visit in 2 weeks.   Contact information:   [address] [employer] [place] [address] [phone]          Signed: Lowella Dandy 03/23/2012, 8:14 AM

## 2012-03-21 NOTE — Progress Notes (Addendum)
7 Days Post-Op Procedure(s) (LRB): FLEXIBLE BRONCHOSCOPY (N/A) THOROCOTOMY WITH LOBECTOMY (Right) Subjective:  Mrs. Creighton feels sore and a little tired this morning.    Objective: Vital signs in last 24 hours: Temp:  [97.6 F (36.4 C)-98.5 F (36.9 C)] 97.6 F (36.4 C) (05/22 0409) Pulse Rate:  [67-81] 67  (05/22 0409) Cardiac Rhythm:  [-] Normal sinus rhythm (05/21 1935) Resp:  [18] 18  (05/22 0409) BP: (145-158)/(66-82) 148/77 mmHg (05/22 0409) SpO2:  [95 %-97 %] 95 % (05/22 0409)  Intake/Output from previous day: 05/21 0701 - 05/22 0700 In: 780 [P.O.:720; I.V.:60] Out: 1030 [Urine:1030]  General appearance: alert, cooperative and no distress Heart: regular rate and rhythm Lungs: clear to auscultation bilaterally Abdomen: soft, non-tender; bowel sounds normal; no masses,  no organomegaly Wound: clean and dry  Lab Results: No results found for this basename: WBC:2,HGB:2,HCT:2,PLT:2 in the last 72 hours BMET: No results found for this basename: NA:2,K:2,CL:2,CO2:2,GLUCOSE:2,BUN:2,CREATININE:2,CALCIUM:2 in the last 72 hours  PT/INR: No results found for this basename: LABPROT,INR in the last 72 hours ABG    Component Value Date/Time   PHART 7.458* 03/15/2012 0405   HCO3 26.0* 03/15/2012 0405   TCO2 27 03/15/2012 0405   O2SAT 98.0 03/15/2012 0405   CBG (last 3)   Basename 03/20/12 1156 03/20/12 0741 03/19/12 2004  GLUCAP 175* 121* 158*    Assessment/Plan: S/P Procedure(s) (LRB): FLEXIBLE BRONCHOSCOPY (N/A) THOROCOTOMY WITH LOBECTOMY (Right)  2. Resp- final chest tube removed yesterday no evidence of pneumothorax, patient not on oxygen, continue IS 3. Ambulation  4. Pain at incision site, will continue Percocet, Tramadol 5. Dispo- patient looks good, some incisional pain, will aim for d/c home tomorrow     LOS: 7 days    Lowella Dandy 03/21/2012    Chart reviewed, patient examined, agree with above. Her son will not be able to take her home and stay with  her until Friday so will plan discharge Friday.

## 2012-03-22 NOTE — Progress Notes (Signed)
Ambulated with pt approx 800 feet. Pt tolerated very well. No assistance needed.

## 2012-03-22 NOTE — Progress Notes (Addendum)
8 Days Post-Op Procedure(s) (LRB): FLEXIBLE BRONCHOSCOPY (N/A) THOROCOTOMY WITH LOBECTOMY (Right)  Subjective:  [name] has no complaints this morning.    Objective: Vital signs in last 24 hours: Temp:  [97.4 F (36.3 C)-98.4 F (36.9 C)] 98 F (36.7 C) (05/23 0344) Pulse Rate:  [70-74] 74  (05/23 0344) Cardiac Rhythm:  [-] Normal sinus rhythm (05/22 2000) Resp:  [18-19] 19  (05/23 0344) BP: (116-153)/(68-87) 153/86 mmHg (05/23 0344) SpO2:  [95 %-98 %] 95 % (05/23 0344)  Intake/Output from previous day: 05/22 0701 - 05/23 0700 In: 1200 [P.O.:1200] Out: 1301 [Urine:1300; Stool:1]  General appearance: alert, cooperative and no distress Heart: regular rate and rhythm Lungs: clear to auscultation bilaterally Abdomen: soft, non-tender; bowel sounds normal; no masses,  no organomegaly Wound: clean and dry  Lab Results: No results found for this basename: WBC:2,HGB:2,HCT:2,PLT:2 in the last 72 hours BMET: No results found for this basename: NA:2,K:2,CL:2,CO2:2,GLUCOSE:2,BUN:2,CREATININE:2,CALCIUM:2 in the last 72 hours  PT/INR: No results found for this basename: LABPROT,INR in the last 72 hours ABG    Component Value Date/Time   PHART 7.458* 03/15/2012 0405   HCO3 26.0* 03/15/2012 0405   TCO2 27 03/15/2012 0405   O2SAT 98.0 03/15/2012 0405   CBG (last 3)   Basename 03/20/12 1156 03/20/12 0741 03/19/12 2004  GLUCAP 175* 121* 158*    Assessment/Plan: S/P Procedure(s) (LRB): FLEXIBLE BRONCHOSCOPY (N/A) THOROCOTOMY WITH LOBECTOMY (Right)  2. Resp- patient off oxygen, continue IS 3. Ambulation  4. Dispo- patient doing well, will plan for d/c home tomorrow morning      LOS: 8 days    Lowella Dandy 03/22/2012    Chart reviewed, patient examined, agree with above.

## 2012-03-22 NOTE — Progress Notes (Signed)
Pt ambulated with nurse approx 600 feet. Tolerated well, no SOB.

## 2012-03-23 MED ORDER — LACTULOSE 10 GM/15ML PO SOLN
20.0000 g | Freq: Every day | ORAL | Status: DC
  Administered 2012-03-23: 20 g via ORAL
  Filled 2012-03-23: qty 30

## 2012-03-23 NOTE — Progress Notes (Signed)
9 Days Post-Op Procedure(s) (LRB): FLEXIBLE BRONCHOSCOPY (N/A) THOROCOTOMY WITH LOBECTOMY (Right)  Subjective: [name] complains of some right sided soreness and constipation this morning.  She states she hasn't had a bowel movement since Sunday.  Objective: Vital signs in last 24 hours: Temp:  [98.3 F (36.8 C)-98.4 F (36.9 C)] 98.4 F (36.9 C) (05/24 0435) Pulse Rate:  [69-74] 70  (05/24 0435) Cardiac Rhythm:  [-] Normal sinus rhythm (05/23 2100) Resp:  [18] 18  (05/24 0435) BP: (128-165)/(73-74) 128/74 mmHg (05/24 0435) SpO2:  [95 %-97 %] 95 % (05/24 0435)  Intake/Output from previous day: 05/23 0701 - 05/24 0700 In: 1080 [P.O.:1080] Out: 2350 [Urine:2350]  General appearance: alert, cooperative and no distress Neurologic: intact Heart: regular rate and rhythm Lungs: clear to auscultation bilaterally Abdomen: soft, non-tender; bowel sounds normal; no masses,  no organomegaly Wound: clean and dry  Lab Results: No results found for this basename: WBC:2,HGB:2,HCT:2,PLT:2 in the last 72 hours BMET: No results found for this basename: NA:2,K:2,CL:2,CO2:2,GLUCOSE:2,BUN:2,CREATININE:2,CALCIUM:2 in the last 72 hours  PT/INR: No results found for this basename: LABPROT,INR in the last 72 hours ABG    Component Value Date/Time   PHART 7.458* 03/15/2012 0405   HCO3 26.0* 03/15/2012 0405   TCO2 27 03/15/2012 0405   O2SAT 98.0 03/15/2012 0405   CBG (last 3)   Basename 03/20/12 1156  GLUCAP 175*    Assessment/Plan: S/P Procedure(s) (LRB): FLEXIBLE BRONCHOSCOPY (N/A) THOROCOTOMY WITH LOBECTOMY (Right)  2. Resp- encouraged continued IS use 3. Constipation-ordered Lactulose 4. Dispo- patient is doing well, will discharge home today   LOS: 9 days    [name] 03/23/2012

## 2012-03-27 ENCOUNTER — Ambulatory Visit (INDEPENDENT_AMBULATORY_CARE_PROVIDER_SITE_OTHER): Payer: Self-pay

## 2012-03-27 DIAGNOSIS — Z4802 Encounter for removal of sutures: Secondary | ICD-10-CM

## 2012-03-27 NOTE — Progress Notes (Signed)
Removed 2 sutures from chest tube sites, no signs of infection, pt tolerated well. Appt scheduled with PA on 04/02/2012 with CXR.

## 2012-03-29 LAB — AFB CULTURE WITH SMEAR (NOT AT ARMC): Acid Fast Smear: NONE SEEN

## 2012-03-30 ENCOUNTER — Other Ambulatory Visit: Payer: Self-pay | Admitting: Surgery

## 2012-03-30 DIAGNOSIS — D381 Neoplasm of uncertain behavior of trachea, bronchus and lung: Secondary | ICD-10-CM

## 2012-04-02 ENCOUNTER — Ambulatory Visit (INDEPENDENT_AMBULATORY_CARE_PROVIDER_SITE_OTHER): Payer: Self-pay | Admitting: Physician Assistant

## 2012-04-02 ENCOUNTER — Ambulatory Visit
Admission: RE | Admit: 2012-04-02 | Discharge: 2012-04-02 | Disposition: A | Discharge status: Home / Self Care | Payer: Medicare Other | Source: Ambulatory Visit | Attending: Surgery | Admitting: Surgery

## 2012-04-02 VITALS — BP 133/80 | HR 79 | Resp 16 | Ht 66.0 in | Wt 141.0 lb

## 2012-04-02 DIAGNOSIS — Z09 Encounter for follow-up examination after completed treatment for conditions other than malignant neoplasm: Secondary | ICD-10-CM

## 2012-04-02 DIAGNOSIS — C3490 Malignant neoplasm of unspecified part of unspecified bronchus or lung: Secondary | ICD-10-CM

## 2012-04-02 DIAGNOSIS — D381 Neoplasm of uncertain behavior of trachea, bronchus and lung: Secondary | ICD-10-CM

## 2012-04-02 MED ORDER — POTASSIUM CHLORIDE ER 10 MEQ PO TBCR: 20.0000 meq | EXTENDED_RELEASE_TABLET | Freq: Every day | ORAL | Status: DC

## 2012-04-02 MED ORDER — FUROSEMIDE 20 MG PO TABS: 20.0000 mg | ORAL_TABLET | Freq: Every day | ORAL | Status: DC

## 2012-04-02 NOTE — Progress Notes (Signed)
  HPI:  Patient returns for routine postoperative follow-up having undergone Flexible fiberoptic bronchoscopy, right sided thoracotomy with right middle and lower lobectomy with mediastinal lymph node resection   on 03/14/2012. The patient's early postoperative recovery while in the hospital was unremarkable. Since hospital discharge the patient reports that she has been doing okay.  She did have an episode of shortness of breath while out shopping, but denies dyspnea at rest or inability to lay flat.  She does have some mild LE edema.  She is ambulating without difficulty and does not have any drainage from her surgical incisions.  Current Outpatient Prescriptions  Medication Sig Dispense Refill  . aspirin 81 MG tablet Take 81 mg by mouth daily.       . carvedilol (COREG) 6.25 MG tablet Take 1 tablet (6.25 mg total) by mouth 2 (two) times daily with a meal.  90 tablet  3  . clopidogrel (PLAVIX) 75 MG tablet Take 1 tablet (75 mg total) by mouth daily.  90 tablet  3  . nitroGLYCERIN (NITROSTAT) 0.4 MG SL tablet Place 0.4 mg under the tongue every 5 (five) minutes as needed. For chest pains up to 3 doses. If pain still persist call emergency personal.       . simvastatin (ZOCOR) 40 MG tablet Take 1 tablet (40 mg total) by mouth at bedtime.  90 tablet  3  . furosemide (LASIX) 20 MG tablet Take 1 tablet (20 mg total) by mouth daily. For 7 days  7 tablet  0  . potassium chloride (K-DUR) 10 MEQ tablet Take 2 tablets (20 mEq total) by mouth daily. For 7 days  7 tablet  0  . DISCONTD: furosemide (LASIX) 20 MG tablet Take 1 tablet (20 mg total) by mouth daily. For 7 days  7 tablet  0  . DISCONTD: potassium chloride (K-DUR) 10 MEQ tablet Take 2 tablets (20 mEq total) by mouth daily. For 7 days  7 tablet  0    Physical Exam:  BP 133/80  Pulse 79  Resp 16  Ht 5\' 6"  (1.676 m)  Wt 141 lb (63.957 kg)  BMI 22.76 kg/m2  SpO2 96%  Gen: no apparent distress Lungs: Decreased right base, CTA  bilaterally Heart: RRR Abd: soft non-tender, non-distended Skin: incisions healing well, no acute evidence of infectious process  Diagnostic Tests:  CXR: small right pleural effusion, no evidence of pneumothorax  Impression:  [name] is a S/P Right Middle and Lower Lobectomy.  Overall she is doing very well. She does have some occasional exertional dyspnea, which can be expected based on her surgical procedure.  Plan:  [name] is medically stable at this time.  The accumulation of pleural fluid on the right is most likely accumulating due to empty space S/P Lobectomy.  Due to her LE edema, I will give her a one week script for Lasix.  I instructed the patient to contact our office should her dyspnea become progressively worse or develops at rest.  The patient was also instructed to follow up with her Pulmonologist, which she is planning to make an appointment for next week.  We will have her follow up with Dr. Laneta Simmers in one month with repeat CXR.

## 2012-04-11 ENCOUNTER — Encounter: Payer: Self-pay | Admitting: Cardiovascular Disease

## 2012-04-11 ENCOUNTER — Ambulatory Visit (INDEPENDENT_AMBULATORY_CARE_PROVIDER_SITE_OTHER): Payer: Medicare Other | Admitting: Cardiovascular Disease

## 2012-04-11 ENCOUNTER — Ambulatory Visit: Payer: Medicare Other | Admitting: Cardiovascular Disease

## 2012-04-11 VITALS — BP 120/76 | HR 68 | Ht 66.0 in | Wt 134.8 lb

## 2012-04-11 DIAGNOSIS — I2119 ST elevation (STEMI) myocardial infarction involving other coronary artery of inferior wall: Secondary | ICD-10-CM

## 2012-04-11 DIAGNOSIS — E7800 Pure hypercholesterolemia, unspecified: Secondary | ICD-10-CM

## 2012-04-11 MED ORDER — SIMVASTATIN 40 MG PO TABS: 40.0000 mg | ORAL_TABLET | Freq: Every day | ORAL | Status: DC

## 2012-04-11 MED ORDER — CARVEDILOL 6.25 MG PO TABS: 6.2500 mg | ORAL_TABLET | Freq: Two times a day (BID) | ORAL | Status: DC

## 2012-04-11 NOTE — Progress Notes (Signed)
HPI:  78 year old woman presenting for followup evaluation. The patient has coronary artery disease and presented with an inferoposterior MI in July 2012. She underwent PCI to right coronary artery and then underwent staged PCI the left circumflex. Her LAD disease was treated medically. A followup nuclear stress scan showed no significant ischemia and her left ventricular ejection fraction was normal at 67%. The patient was subsequently diagnosed with lung cancer. She underwent right middle and lower lobectomy with mediastinal lymph node resection. She is now about one month out from her surgery.  Overall she feels like she is doing fairly well. She has shortness of breath with activity which is obviously expected after the surgery. She's had some leg swelling and this has resolved with the use of Lasix. She denies chest pain or pressure. She denies lightheadedness or syncope.  Outpatient Encounter Prescriptions as of 04/11/2012  Medication Sig Dispense Refill  . aspirin 81 MG tablet Take 81 mg by mouth daily.       . carvedilol (COREG) 6.25 MG tablet Take 1 tablet (6.25 mg total) by mouth 2 (two) times daily with a meal.  90 tablet  3  . clopidogrel (PLAVIX) 75 MG tablet Take 1 tablet (75 mg total) by mouth daily.  90 tablet  3  . nitroGLYCERIN (NITROSTAT) 0.4 MG SL tablet Place 0.4 mg under the tongue every 5 (five) minutes as needed. For chest pains up to 3 doses. If pain still persist call emergency personal.       . oxyCODONE-acetaminophen (PERCOCET) 5-325 MG per tablet Take 1 tablet by mouth every 4 (four) hours as needed.      . simvastatin (ZOCOR) 40 MG tablet Take 1 tablet (40 mg total) by mouth at bedtime.  90 tablet  3  . DISCONTD: furosemide (LASIX) 20 MG tablet Take 1 tablet (20 mg total) by mouth daily. For 7 days  7 tablet  0  . DISCONTD: potassium chloride (K-DUR) 10 MEQ tablet Take 2 tablets (20 mEq total) by mouth daily. For 7 days  7 tablet  0    Allergies  Allergen Reactions    . Penicillins Hives    Past Medical History  Diagnosis Date  . Hypertension   . Dyslipidemia   . CAD (coronary artery disease)     a. 05/2011 [facility] MI;  b. 05/2011 Cath/PCI RCA - DES;  c. 05/2011 Staged PCI LCX, residual mod LAD dzs;  d. 2012 nonischemic Myoveiw, EF 67%.  . Myocardial infarction     05/2011 DR Jahsiah Carpenter [facility] )  . Heart murmur   . Shortness of breath   . Headache   . Breast cancer   . Cancer of eye   . Lung cancer     a. 2013 bronchoscopy - right middle lobe mass + for carcinoid  . Arthritis     ROS: Negative except as per HPI  BP 120/76  Pulse 68  Ht 5\' 6"  (1.676 m)  Wt 61.145 kg (134 lb 12.8 oz)  BMI 21.76 kg/m2  PHYSICAL EXAM: Pt is alert and oriented, NAD HEENT: normal Neck: JVP - normal, carotids 2+= without bruits Lungs: CTA bilaterally with decreased breath sounds over the right upper lung fields Chest: Well-healed surgical scar over the right posterior chest CV: RRR without murmur or gallop, distant Abd: soft, NT, Positive BS, no hepatomegaly Ext: no C/C/E, distal pulses intact and equal Skin: warm/dry no rash  EKG:  Normal sinus rhythm 68 beats per minute, borderline criteria for LVH.  ASSESSMENT AND PLAN: 1. CAD status post MI and multivessel stenting. The patient is stable without anginal symptoms. She has now been through major surgery without perioperative cardiac problems. Her aspirin and Plavix were on hold and she had no problems with that. I have recommended that she continue Plavix through next month and then she can stop it as she will be 12 months out from her myocardial infarction. She should remain on low-dose aspirin long-term.  2. Hyperlipidemia. Last lipid check September 2012 and they show a cholesterol of 111, triglycerides 63, HDL 28, and LDL 71. She is on simvastatin. She should have followup lipids and LFTs in September of this year.  For followup, I would like to see the patient back in 12 months.  Tonny Bollman 04/11/2012 3:10 PM

## 2012-04-11 NOTE — Patient Instructions (Addendum)
Your physician wants you to follow-up in: ONE YEAR WITH DR Theodoro Parma will receive a reminder letter in the mail two months in advance. If you don't receive a letter, please call our office to schedule the follow-up appointment.   STOP PLAVIX WHEN CURRENT SCRIPT DONE  Your physician recommends that you return for a FASTING lipid profile: IN September 2013

## 2012-04-12 ENCOUNTER — Other Ambulatory Visit: Payer: Self-pay

## 2012-04-12 DIAGNOSIS — C3490 Malignant neoplasm of unspecified part of unspecified bronchus or lung: Secondary | ICD-10-CM

## 2012-04-13 ENCOUNTER — Telehealth: Payer: Self-pay | Admitting: Oncology

## 2012-04-13 ENCOUNTER — Ambulatory Visit: Payer: Medicare Other | Admitting: Pulmonary Disease

## 2012-04-13 ENCOUNTER — Ambulatory Visit (HOSPITAL_BASED_OUTPATIENT_CLINIC_OR_DEPARTMENT_OTHER): Payer: Medicare Other | Admitting: Oncology

## 2012-04-13 ENCOUNTER — Encounter: Payer: Self-pay | Admitting: Oncology

## 2012-04-13 ENCOUNTER — Ambulatory Visit (HOSPITAL_BASED_OUTPATIENT_CLINIC_OR_DEPARTMENT_OTHER): Payer: Medicare Other | Admitting: Lab

## 2012-04-13 VITALS — BP 152/77 | HR 68 | Temp 97.5°F | Ht 66.0 in | Wt 139.4 lb

## 2012-04-13 DIAGNOSIS — C7A090 Malignant carcinoid tumor of the bronchus and lung: Secondary | ICD-10-CM

## 2012-04-13 DIAGNOSIS — E279 Disorder of adrenal gland, unspecified: Secondary | ICD-10-CM

## 2012-04-13 DIAGNOSIS — D649 Anemia, unspecified: Secondary | ICD-10-CM

## 2012-04-13 DIAGNOSIS — C3490 Malignant neoplasm of unspecified part of unspecified bronchus or lung: Secondary | ICD-10-CM

## 2012-04-13 DIAGNOSIS — Z853 Personal history of malignant neoplasm of breast: Secondary | ICD-10-CM

## 2012-04-13 LAB — COMPREHENSIVE METABOLIC PANEL
Albumin: 3.4 g/dL — ABNORMAL LOW (ref 3.5–5.2)
BUN: 17 mg/dL (ref 6–23)
CO2: 26 mEq/L (ref 19–32)
Calcium: 9.1 mg/dL (ref 8.4–10.5)
Chloride: 106 mEq/L (ref 96–112)
Creatinine, Ser: 0.71 mg/dL (ref 0.50–1.10)
Glucose, Bld: 79 mg/dL (ref 70–99)
Potassium: 4.4 mEq/L (ref 3.5–5.3)

## 2012-04-13 LAB — LACTATE DEHYDROGENASE: LDH: 178 U/L (ref 94–250)

## 2012-04-13 LAB — CBC WITH DIFFERENTIAL/PLATELET
Basophils Absolute: 0 10*3/uL (ref 0.0–0.1)
Eosinophils Absolute: 0.4 10*3/uL (ref 0.0–0.5)
HCT: 30.2 % — ABNORMAL LOW (ref 34.8–46.6)
HGB: 10.2 g/dL — ABNORMAL LOW (ref 11.6–15.9)
MCH: 30.1 pg (ref 25.1–34.0)
MONO#: 0.6 10*3/uL (ref 0.1–0.9)
NEUT#: 3.8 10*3/uL (ref 1.5–6.5)
NEUT%: 61.5 % (ref 38.4–76.8)
RDW: 15 % — ABNORMAL HIGH (ref 11.2–14.5)
lymph#: 1.3 10*3/uL (ref 0.9–3.3)

## 2012-04-13 NOTE — Progress Notes (Signed)
This office note has been dictated.  #[id number]

## 2012-04-13 NOTE — Progress Notes (Signed)
CC:   Veverly Fells. Excell Seltzer, MD Eber Hong, MD Barbaraann Share, MD,FCCP Evelene Croon, M.D.  PROBLEM LIST:  1. Right lung mass detected on CT angiogram of the chest carried out  on 01/17/2012 when the patient presented to the emergency room with  hemoptysis.  The right lung mass turned out to be a well differentiated low-grade neuroendocrine tumor as determined on bronchoscopy by Dr. Marcelyn Bruins on 02/14/2012.  The patient underwent surgery by Dr. Evelene Croon on 03/14/2012.  This consisted of a right middle and right lower lobectomy with mediastinal lymph node dissection.  The pathology report ([id number]) indicates a well-differentiated neuroendocrine tumor measuring 3.8 cm in greatest dimension.  No lymphovascular invasion was noted.  The bronchial margin of the right middle lobe was positive for neuroendocrine tumor.  The bronchial margin of the right lower lobe was negative.  Five lymph nodes were removed and all were negative. Stage was pT2a,pN0,M0: IB  2. Right adrenal mass measuring 3.3 cm on CT angiogram of chest from  01/17/2012. No significant activity on PET scan done 01/30/12.  3. History of left-sided breast cancer, for which the patient  underwent left modified radical mastectomy in [place], [place], in  1990. We do not have records. The tumor was large. Lymph nodes  were negative. Following surgery, the patient was placed on  tamoxifen for 5 years.  4. History of right-sided breast cancer which apparently was detected  by mammogram after the patient had stopped taking tamoxifen  somewhere in the mid 90s. She underwent a right-sided mastectomy,  along with lymph node dissection. Apparently this was a relatively  small tumor. Lymph nodes were negative. Following surgery, the  patient was again on tamoxifen for another 5 years. We do not have  these records.  5. The patient apparently had a tumor, possibly malignant involving  the skin of the right orbit, status post  surgery at [facility] in 2007. We do not have any records and unfortunately no  other history. The patient had just surgery, no other treatments  or followup.  6. Coronary artery disease, status post acute myocardial infarction in  early July 2012. She underwent cardiac catheterization and had 2  stents placed. She is under the care of Dr. Sharol Harness. She is  on aspirin and Plavix.  7. Hypertension.  8. Dyslipidemia.  9. Diverticulosis of the colon.  10.History of bleeding ulcers requiring blood transfusions when the  patient was in her early 30s.  11.History of cigarette smoking from 1955 to about 1990, i.e. for  about 35 years. The patient has stopped smoking.  12.Osteoarthritis involving fingers and hands.  13.Systolic ejection murmur.   CURRENT MEDICINES:  1. Aspirin 81 mg daily.  2. Tessalon Perles 100-mg capsules two 2 to 3 times a day as needed  for cough. She has been taking this with benefit.  3. Coreg 6.25 mg twice daily with meals.  4. Plavix 75 mg daily.  5. Nitroglycerin as needed. The patient has not had to take this.  6. Zocor 40 mg at bedtime. 7. Oxycodone 5/325 mg as needed.  Currently the patient is taking 1 at night.   HISTORY: Ms. Hascall is here today with her sister, Okey Regal.  The patient is currently staying with her son Kathlene November here in [place], telephone [phone].  The patient was last seen by [place] on 02/08/2012.  She seems to be making a good recovery from surgery.  She has been some minimal pain for which  she is taking OxyIR.  She is not having any cough or hemoptysis.  She does have a little bit of dyspnea on exertion. Energy is improving.  Her appetite also is improving.  The patient is without any major complaints today.   PHYSICAL EXAM:  The patient looks well.  Weight today is 139.4 pounds, height 5 feet 6 inches, body surface area 1.72 m2.  Blood pressure 152/77.  Other vital signs are normal.  There is no scleral  icterus. Mouth and pharynx benign.  No peripheral adenopathy palpable. Heart/Lungs:  Normal.  Lungs were clear.  The patient has had bilateral mastectomies related to bilateral breast cancers as noted above. Abdomen:  Benign.  Incisions on her right chest wall have healed well. Extremities:  No peripheral edema.  She has some arthritic changes in her hand and her fingers.  Neurologic:  Normal.  LABORATORY DATA:  Today, white count 6.1, ANC 3.8, hemoglobin 10.2, hematocrit 30.2, platelets 200,000.  Chemistries and LDH are pending. Chemistries from 03/12/2012 were notable for an albumin of 3.2.  BUN was 15, creatinine 0.71.  IMAGING STUDIES:  1. Chest x-ray, 2 view, from 01/17/2012 showed a 2.0-cm nodular  density at the left lung base.  2. CT angiogram of the chest on 01/17/2012 showed no evidence for  pulmonary embolism. There was a multilobulated soft tissue mass  noted at and below the right hilum with extension peripherally  along the right major fissure. Given this extension along the  fissure, the mass measured approximately 10 cm in length and  approximately 2.9 x 2.3 cm near the right hilum. The appearance  raised concern for bronchogenic malignancy. Some of the extensions  appeared to fill and expand the distal bronchials. The focal  density overlying the left lung base on the prior chest radiograph  was not well characterized on CT and may have reflected  costochondral calcification. There were scattered calcifications  noted along the descending thoracic aorta. There were dense  calcifications along the coronary arteries. Mediastinum was  otherwise unremarkable. Though the mass arises at the right hilum,  no separate hilar nodes were identified. Visualized portions of  liver and spleen were unremarkable. There was a somewhat complex  3.3-cm mass noted in the right adrenal gland. There was some  increased attenuation in the central focus, but this was felt to be   nonspecific. Scattered diverticulosis was noted in the colon.  3. MRI of the head with and without IV contrast on 01/30/2012 showed  atrophy and small vessel disease. There was no obvious metastatic  disease, and no abnormal intracranial enhancement.  4. PET scan on 01/30/2012 showed branching opacity which could reflect  soft tissue/mucus plugging within a segmental right middle lobe  bronchus. Centrally there was a 2.9 x 2.5 cm nodular opacity with  associated mild hypermetabolism along its lateral/peripheral  margin, maximum SUV 4.3. There were no hypermetabolic mediastinal  or hilar nodes. There was no abnormal hypermetabolic activity  within the liver, pancreas, adrenal glands or spleen. No  hypermetabolic lymph nodes in the abdomen or pelvis. No focal  hypermetabolic activity within the skeletal system. Conclusion was  that the appearance of the scan was indeterminate. Could reflect a  benign lesion with postobstructive infection/inflammation; however,  tumor could not be excluded. Bronchoscopy with tissue sampling was  suggested. 5. Chest x-ray, 2 view, from 03/21/2012 shows minimal increases in the     fluid component, otherwise unchanged moderate size right sided     hydropneumothorax. 6. Chest  x-ray, 2 view, from 04/02/2012 shows no pneumothorax.  There     was a slight increase in the volume of the right pleural effusion.     Left lung was clear.   IMPRESSION AND PLAN:  Mrs. Navratil was last seen by [place] on 02/08/2012. Since then, she has undergone resection of her tumor that turned out to be a well differentiated low-grade neuroendocrine tumor, pathologic stage T2a N0 M0.  In going over the pathology report, I am concerned about the possibility of tumor at the bronchial margin of the right middle lobe.  I plan to consult with the pathologist about this.  If this is indeed the case, the patient may need evaluation by radiation oncology to see whether radiation would be  indicated at this time, or whether the patient should simply be observed for now given her age.  Of note today is the patient's anemia.  I suggested she may want to take some iron.  We will plan to see Mrs. Cogliano again in 2 months at which time will check CBC and chemistries.  If she is still significantly anemic at that time, then we may want to do an anemia workup.  It will be recalled that on 01/25/2012 her hemoglobin was 12.3.    ______________________________ Samul Dada, M.D. DSM/MEDQ  D:  04/13/2012  T:  04/13/2012  Job:  [id number]

## 2012-04-13 NOTE — Telephone Encounter (Signed)
gv pt appt schedule for August.  °

## 2012-04-16 ENCOUNTER — Encounter: Payer: Self-pay | Admitting: Oncology

## 2012-04-16 NOTE — Progress Notes (Signed)
The case was discussed with Dr. Baruch Merl and Dr. Margaretmary Dys. I have a call into Dr. Evelene Croon who was on vacation last week. It does appear that all margins were free of tumor after the bronchus intermedius was divided proximally just beyond the takeoff of the upper lobe bronchus which resulted in a right lower lobectomy. This followed the earlier right middle lobectomy where the margin at the right middle lobe bronchus was initially positive.

## 2012-04-19 ENCOUNTER — Encounter: Payer: Self-pay | Admitting: Pulmonary Disease

## 2012-04-19 ENCOUNTER — Ambulatory Visit (INDEPENDENT_AMBULATORY_CARE_PROVIDER_SITE_OTHER): Payer: Medicare Other | Admitting: Pulmonary Disease

## 2012-04-19 VITALS — BP 126/80 | HR 57 | Temp 98.1°F | Ht 66.0 in | Wt 139.4 lb

## 2012-04-19 DIAGNOSIS — J449 Chronic obstructive pulmonary disease, unspecified: Secondary | ICD-10-CM | POA: Insufficient documentation

## 2012-04-19 DIAGNOSIS — R918 Other nonspecific abnormal finding of lung field: Secondary | ICD-10-CM

## 2012-04-19 DIAGNOSIS — R222 Localized swelling, mass and lump, trunk: Secondary | ICD-10-CM

## 2012-04-19 NOTE — Patient Instructions (Addendum)
Try chlorpheniramine 4mg   2 tabs at bedtime each night for next few weeks, then as needed for postnasal drip Can use nasal saline spray am and pm as needed for dryness Increase activity as tolerated. followup with me as needed.  Please call if you have worsening shortness of breath.

## 2012-04-19 NOTE — Progress Notes (Signed)
  Subjective:    Patient ID: [name], female    DOB: [birth year], 78 y.o.   MRN: [id number]  HPI The patient comes in today for followup after her recent surgery for carcinoid tumor.  She has done fairly well since that time, and currently denies any cough, shortness of breath, or chest congestion.  She has the expected soreness, but has been increasing her activity.  Her only complaint today is that of postnasal drip.   Review of Systems  Constitutional: Negative.  Negative for fever and unexpected weight change.  HENT: Positive for rhinorrhea. Negative for ear pain, nosebleeds, congestion, sore throat, sneezing, trouble swallowing, dental problem, postnasal drip and sinus pressure.   Eyes: Negative.  Negative for redness and itching.  Respiratory: Positive for cough, chest tightness, shortness of breath and wheezing.   Cardiovascular: Negative.  Negative for palpitations and leg swelling.  Gastrointestinal: Negative.  Negative for nausea and vomiting.  Genitourinary: Negative.  Negative for dysuria.  Musculoskeletal: Negative.  Negative for joint swelling.  Skin: Negative.  Negative for rash.  Neurological: Positive for headaches.  Hematological: Negative.  Does not bruise/bleed easily.  Psychiatric/Behavioral: Negative for dysphoric mood. The patient is nervous/anxious.        Objective:   Physical Exam Thin female in no acute distress Nose with mild edema of nasal mucosa, no purulence Oropharynx clear Chest with a squeak and mildly decreased breath sounds in the right base, otherwise clear Cardiac exam with regular rate and rhythm Lower extremities with no edema, no cyanosis Alert and oriented, moves all 4 extremities.       Assessment & Plan:

## 2012-04-19 NOTE — Assessment & Plan Note (Signed)
The patient has a history of very mild COPD preoperatively, but remains asymptomatic.  I would not recommend any bronchodilator therapy unless she begins to have increased symptoms.

## 2012-04-24 ENCOUNTER — Telehealth: Payer: Self-pay | Admitting: Pulmonary Disease

## 2012-04-24 ENCOUNTER — Other Ambulatory Visit: Payer: Self-pay | Admitting: Surgery

## 2012-04-24 DIAGNOSIS — D381 Neoplasm of uncertain behavior of trachea, bronchus and lung: Secondary | ICD-10-CM

## 2012-04-24 NOTE — Telephone Encounter (Signed)
Pt was recently seen by Dr. Shelle Iron on 04/19/12.  I do not see where any orders where placed at this visit.  Lawson Fiscal, did you or Dr. Shelle Iron try calling pt?

## 2012-05-01 ENCOUNTER — Encounter: Payer: Self-pay | Admitting: Surgery

## 2012-05-01 ENCOUNTER — Ambulatory Visit (INDEPENDENT_AMBULATORY_CARE_PROVIDER_SITE_OTHER): Payer: Self-pay | Admitting: Surgery

## 2012-05-01 ENCOUNTER — Ambulatory Visit
Admission: RE | Admit: 2012-05-01 | Discharge: 2012-05-01 | Disposition: A | Discharge status: Home / Self Care | Payer: Medicare Other | Source: Ambulatory Visit | Attending: Surgery | Admitting: Surgery

## 2012-05-01 VITALS — BP 156/78 | HR 78 | Resp 17 | Ht 66.0 in | Wt 137.0 lb

## 2012-05-01 DIAGNOSIS — D381 Neoplasm of uncertain behavior of trachea, bronchus and lung: Secondary | ICD-10-CM

## 2012-05-01 DIAGNOSIS — C3490 Malignant neoplasm of unspecified part of unspecified bronchus or lung: Secondary | ICD-10-CM

## 2012-05-01 DIAGNOSIS — R222 Localized swelling, mass and lump, trunk: Secondary | ICD-10-CM

## 2012-05-01 NOTE — Progress Notes (Signed)
[address]            [name] [address]          [id number]     HPI:  Patient returns for routine postoperative follow-up having undergone right thoracotomy for right middle and lower lobectomy on 03/14/2012. The patient's early postoperative recovery while in the hospital was notable for an uncomplicated postoperative course. Since hospital discharge the patient reports that she is continuing to improve. Her appetite is good. Her energy level is improving. She still has mild incisional soreness. She has mild dyspnea if she really exerts herself.   Current Outpatient Prescriptions  Medication Sig Dispense Refill  . aspirin 81 MG tablet Take 81 mg by mouth daily.       . carvedilol (COREG) 6.25 MG tablet Take 1 tablet (6.25 mg total) by mouth 2 (two) times daily with [name] meal.  180 tablet  3  . clopidogrel (PLAVIX) 75 MG tablet Take 75 mg by mouth daily.      . nitroGLYCERIN (NITROSTAT) 0.4 MG SL tablet Place 0.4 mg under the tongue every 5 (five) minutes as needed. For chest pains up to 3 doses. If pain still persist call emergency personal.       . simvastatin (ZOCOR) 40 MG tablet Take 1 tablet (40 mg total) by mouth at bedtime.  90 tablet  3    Physical Exam: BP 156/78  Pulse 78  Resp 17  Ht 5\' 6"  (1.676 m)  Wt 137 lb (62.143 kg)  BMI 22.11 kg/m2  SpO2 96% She looks well. There is no cervical, supraclavicular, or axillary adenopathy. She has had bilateral mastectomies. Lung exam is clear. The right chest incision is healing well. Cardiac exam shows [name] regular rate and rhythm with normal heart sounds.  Diagnostic Tests:  Patient Name: [name], [name] Accession #: [id number] DOB: September 16, 1934 Age: 14 Gender: F Client Name [name]. [facility] Collected Date: 03/14/2012 Received Date: 03/14/2012 Physician: [name] Chart #: MRN # : [id number] Physician cc: Race:W Visit #: [id number] REPORT OF SURGICAL PATHOLOGY FINAL  DIAGNOSIS Diagnosis 1. Lung, resection (segmental or lobe), Right middle - WELL-DIFFERENTIATED NEUROENDOCRINE TUMOR, SPANNING 3.8 CM IN GREATEST DIMENSION. - LYMPHOVASCULAR INVASION NOT IDENTIFIED. - BRONCHIAL MARGIN OF RIGHT MIDDLE LOBE IS POSITIVE FOR NEUROENDOCRINE TUMOR. - SEE ONCOLOGY TEMPLATE. 2. Lung, resection (segmental or lobe), Right lower - BENIGN LUNG PARENCHYMA WITH CHRONIC FIBROHISTIOCYTIC INFLAMMATION AND FOREIGN BODY GIANT CELL REACTION. - FOUR BENIGN LYMPH NODES (0/4). - NO TUMOR SEEN. - SEE COMMENT. 3. Lymph node, biopsy, 8 - ONE BENIGN LYMPH NODE WITH NO TUMOR SEEN (0/1). Microscopic Comment 1. LUNG Specimen, including laterality: Right middle and lower lobe. Procedure: Right middle and lower lobectomy. Specimen integrity (intact/disrupted): Both right middle and right lower lobes are intact. Tumor site: Right middle lobe just beneath the hilum growing within [name] bronchus. Tumor focality: Unifocal. Maximum tumor size (cm): 3.8 cm. Histologic type: Neuroendocrine tumor. Grade: Low grade. Margins: Bronchial margin of right middle lobe in positive. Bronchial margin of right lower lobe is negative. Visceral pleura invasion: Not identified. Tumor extension: Tumor extends into bronchus. Treatment effect (if treated with neoadjuvant therapy): Not applicable. Lymph -Vascular invasion: Not identified. Lymph nodes: Number examined -5; Number N1 nodes positive 0; Number N2 nodes positive 0. 1 of 3 FINAL for [name], [name] ([id number]) Microscopic Comment(continued) TNM code: pT2a, pN0, MX.  Ancillary studies: Can be performed upon request. Non-neoplastic lung: Chronic fibrohistiocytic inflammation with foreign body giant cell reaction present. Comment: The tumor is >3 cm and there is some degree of nuclear pleomorphism present. Although this is the case, there are not 5 or more mitotic figures per high power field, necrosis or loss of an organoid growth pattern.  Based on these features the tumor is best classified as [name] low grade neuroendocrine tumor. Dr. Luisa Hart has seen this case in consultation with agreement. 2. The right lower lobe shows chronic fibrohistiocytic and foreign body giant cell inflammation. No granulomas are identified. Zandra Abts MD Pathologist, Electronic Signature (Case signed 03/15/2012) Intraoperative Diagnosis 1. LUNG, RIGHT MIDDLE LOBECTOMY: - FROZEN SECTION [name]: BRONCHIAL MARGIN - HIGHLY ATYPICAL CELLULAR NODULE AT BRONCHIAL MARGIN, CANNOT EXCLUDE INVOLVEMENT BY NEOPLASIA. - FROZEN SECTION B: RIGHT MIDDLE LOBE MASS - NEOPLASTIC TISSUE PRESENT. (RAH) 2. RIGHT LOWER LOBECTOMY, BRONCHIAL MARGIN: BRONCHIAL MARGIN IS NEGATIVE. (RAH)    Chest xray today: The right upper lobe is well aerated. The right basilar space that remained after removal of the middle and lower lobe is now filled with fluid and appears stable.   Impression:  She is doing well following right middle and lower lobectomy for [name] T2a, N0, M0 well differentiated neuroendocrine tumor. I initially did [name] right middle lobectomy but she had [name] positive bronchial margin necessitating right lower lobectomy with division of the bronchus intermedius just distal to the right upper lobe bronchial takeoff. The margin of the bronchus intermedius was negative for cancer. She will not require any further treatment for this tumor. I told her that she can continue to increase her activity but should refrain from lifting anything heavy for 3 months postoperatively.  Plan:  I'll see her back in 3 months with [name] chest x-ray.

## 2012-05-19 ENCOUNTER — Other Ambulatory Visit: Payer: Self-pay | Admitting: Cardiovascular Disease

## 2012-06-13 ENCOUNTER — Telehealth: Payer: Self-pay | Admitting: Oncology

## 2012-06-13 NOTE — Telephone Encounter (Signed)
Pt called and cancel app tfor 8/16 lab and MD , per pt, appt not necessary per pt's surgeon

## 2012-06-15 ENCOUNTER — Other Ambulatory Visit: Payer: Medicare Other | Admitting: Lab

## 2012-06-15 ENCOUNTER — Ambulatory Visit: Payer: Medicare Other | Admitting: Oncology

## 2012-07-09 ENCOUNTER — Other Ambulatory Visit (INDEPENDENT_AMBULATORY_CARE_PROVIDER_SITE_OTHER): Payer: Medicare Other

## 2012-07-09 DIAGNOSIS — E7800 Pure hypercholesterolemia, unspecified: Secondary | ICD-10-CM

## 2012-07-09 LAB — HEPATIC FUNCTION PANEL
ALT: 13 U/L (ref 0–35)
AST: 22 U/L (ref 0–37)
Albumin: 3.4 g/dL — ABNORMAL LOW (ref 3.5–5.2)
Alkaline Phosphatase: 76 U/L (ref 39–117)

## 2012-07-09 LAB — LIPID PANEL
HDL: 16.4 mg/dL — ABNORMAL LOW (ref >39.00)
Total CHOL/HDL Ratio: 8
Triglycerides: 89 mg/dL (ref 0.0–149.0)

## 2012-07-30 ENCOUNTER — Other Ambulatory Visit: Payer: Self-pay | Admitting: Surgery

## 2012-07-30 DIAGNOSIS — D381 Neoplasm of uncertain behavior of trachea, bronchus and lung: Secondary | ICD-10-CM

## 2012-07-31 ENCOUNTER — Encounter: Payer: Self-pay | Admitting: Surgery

## 2012-07-31 ENCOUNTER — Ambulatory Visit (INDEPENDENT_AMBULATORY_CARE_PROVIDER_SITE_OTHER): Payer: Medicare Other | Admitting: Surgery

## 2012-07-31 ENCOUNTER — Ambulatory Visit
Admission: RE | Admit: 2012-07-31 | Discharge: 2012-07-31 | Disposition: A | Discharge status: Home / Self Care | Payer: Medicare Other | Source: Ambulatory Visit | Attending: Surgery | Admitting: Surgery

## 2012-07-31 VITALS — BP 159/87 | HR 70 | Resp 16 | Ht 66.0 in | Wt 138.0 lb

## 2012-07-31 DIAGNOSIS — Z09 Encounter for follow-up examination after completed treatment for conditions other than malignant neoplasm: Secondary | ICD-10-CM

## 2012-07-31 DIAGNOSIS — D381 Neoplasm of uncertain behavior of trachea, bronchus and lung: Secondary | ICD-10-CM

## 2012-07-31 DIAGNOSIS — C342 Malignant neoplasm of middle lobe, bronchus or lung: Secondary | ICD-10-CM

## 2012-07-31 NOTE — Progress Notes (Signed)
                    [address]            [name] [address]          [phone]      HPI:  The patient returns today for followup status post right thoracotomy with right middle and lower lobectomy on 03/14/2012 for a T2A, N0 well-differentiated neuroendocrine tumor. She's doing well. She complains of mild dyspnea when walking any significant distance. She has had no chest pain. She denies any cough, sputum production, or hemoptysis. Her weight has been stable. Appetite is good. She denies any headache or visual changes.  Current Outpatient Prescriptions  Medication Sig Dispense Refill  . aspirin 81 MG tablet Take 81 mg by mouth daily.       . carvedilol (COREG) 6.25 MG tablet Take 1 tablet (6.25 mg total) by mouth 2 (two) times daily with a meal.  180 tablet  3  . clopidogrel (PLAVIX) 75 MG tablet TAKE ONE TABLET BY MOUTH EVERY DAY  90 tablet  3  . nitroGLYCERIN (NITROSTAT) 0.4 MG SL tablet Place 0.4 mg under the tongue every 5 (five) minutes as needed. For chest pains up to 3 doses. If pain still persist call emergency personal.       . simvastatin (ZOCOR) 40 MG tablet Take 1 tablet (40 mg total) by mouth at bedtime.  90 tablet  3     Physical Exam: BP 159/87  Pulse 70  Resp 16  Ht 5\' 6"  (1.676 m)  Wt 138 lb (62.596 kg)  BMI 22.27 kg/m2  SpO2 97% She looks well. Exams clear. The right thoracotomy scar is well-healed. There are no skin lesions. There is no cervical or supraclavicular adenopathy. Abdominal exam shows active bowel sounds. Abdomen is soft and nontender with no palpable masses organomegaly.  Diagnostic Tests:  *RADIOLOGY REPORT*   Clinical Data: Follow up pleural effusion   CHEST - 2 VIEW   Comparison: 05/01/2012   Findings: Cardiomediastinal silhouette is stable.  Stable small right pleural effusion with right basilar atelectasis or scarring. Small right anterior hydropneumothorax best seen on lateral view. No pulmonary edema.   Surgical clips bilateral axilla again noted.   IMPRESSION: Stable small right pleural effusion with right basilar atelectasis or scarring.  Small right anterior hydropneumothorax best seen on lateral view.  No pulmonary edema.  Surgical clips bilateral axilla again noted.     Original Report Authenticated By: Natasha Mead, M.D.          Impression:  She is a well is a 4-1/2 months following surgery.  Given her age I don't think she requires any additional treatment for this tumor but will require continued followup. She did have a 3.3 cm right adrenal mass noted on her CT scan which was not hypermetabolic on PET scan. She also has a remote history of bilateral breast cancer. I told her she can return to normal activity.  Plan:  I will see her back in 3 months and we'll repeat her CT scan of the chest at that time.

## 2012-07-31 NOTE — Patient Instructions (Signed)
Resume normal activity without restrictions

## 2012-09-03 ENCOUNTER — Telehealth: Payer: Self-pay | Admitting: Cardiovascular Disease

## 2012-09-03 NOTE — Telephone Encounter (Signed)
New problem":  At [facility] for GI Bleed .  Held plavix  Since11/3 . Wanted the Dr. Excell Seltzer to be aware

## 2012-09-03 NOTE — Telephone Encounter (Signed)
I have paged [name] three times at this time.  I reviewed the pt's chart and per her 04/11/12 office note the pt should be off of plavix.

## 2012-09-04 NOTE — Telephone Encounter (Signed)
I spoke with [place] who is a Psychologist, occupational at [facility] and she wanted to make sure that we are aware plavix was stopped. I made [place] aware that per Dr Earmon Phoenix 04/11/12 office note the pt should have discontinued plavix when she finished her current supply.  I will update the pt's medication list.

## 2012-10-11 IMAGING — CR DG CHEST 2V
2 series · 2 of 2 positions shown · non-contrast
Comparison: CT chest 01/17/2012.

CLINICAL DATA: Right lung mass

CHEST - 2 VIEW

[view not recorded (1 of 2)]
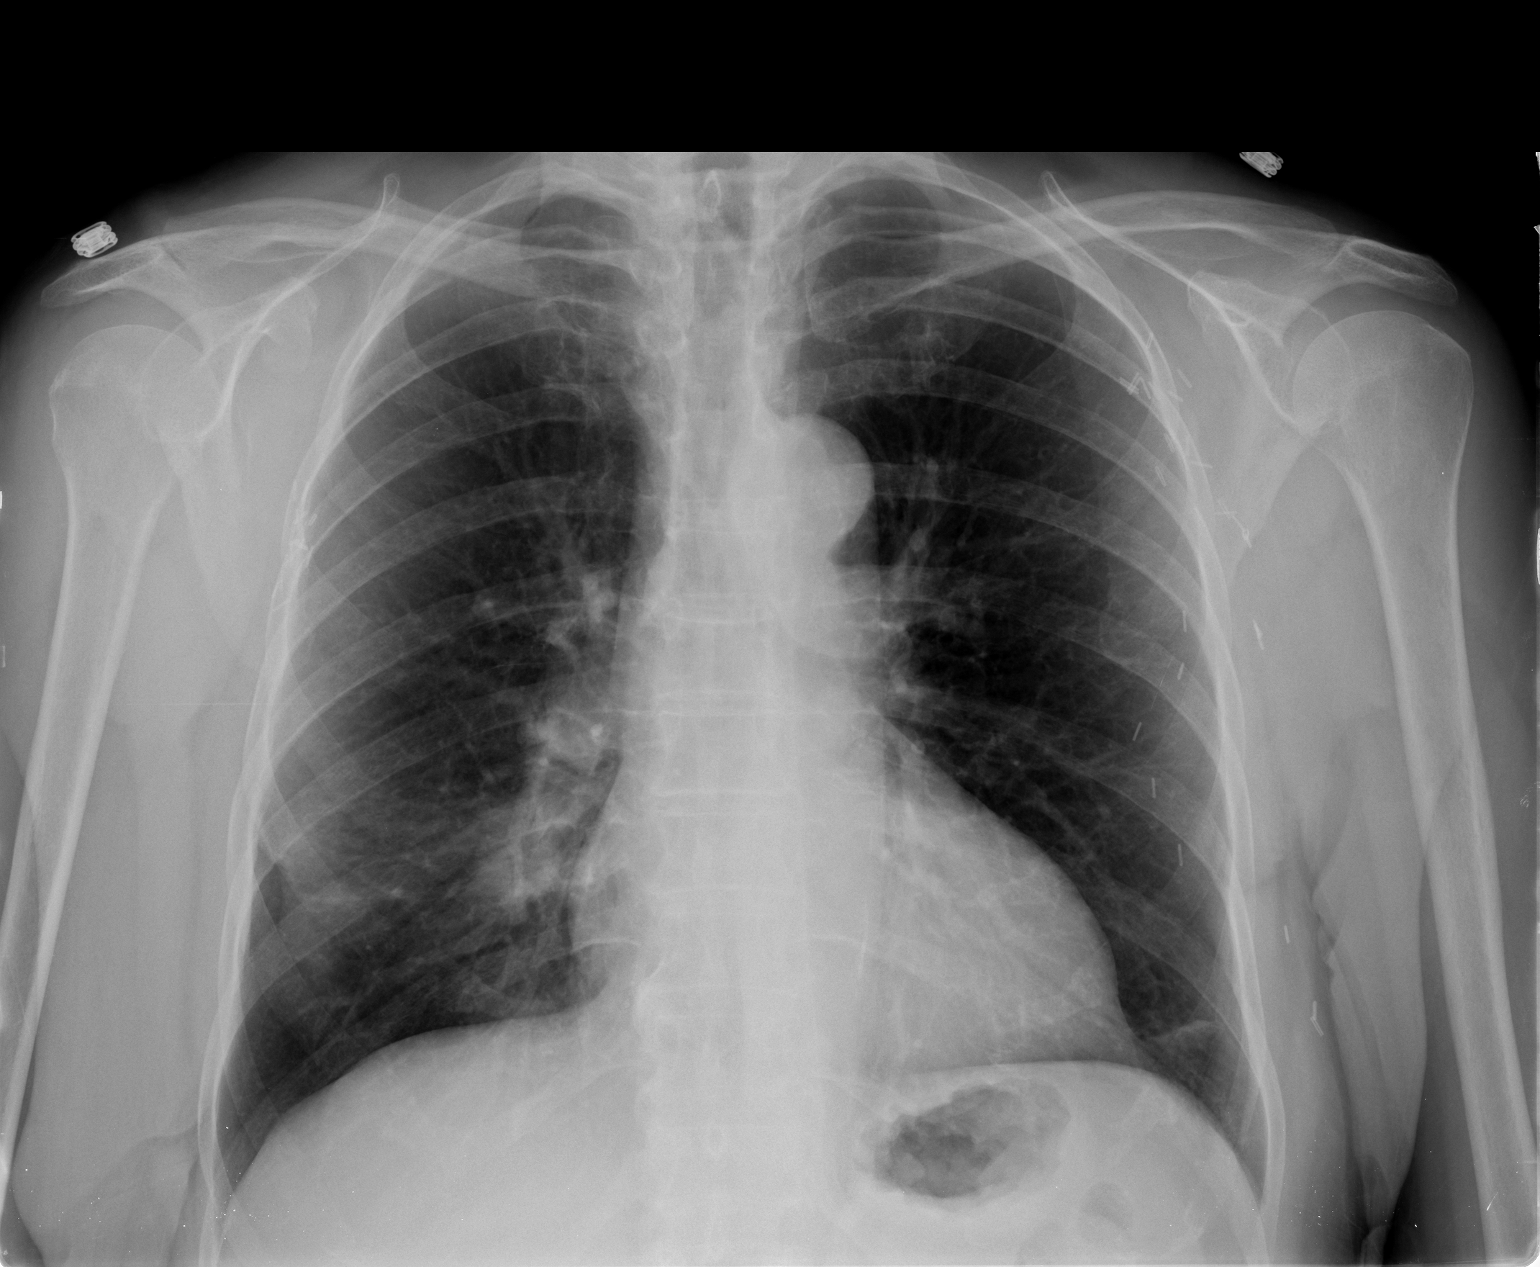

[view not recorded (2 of 2)]
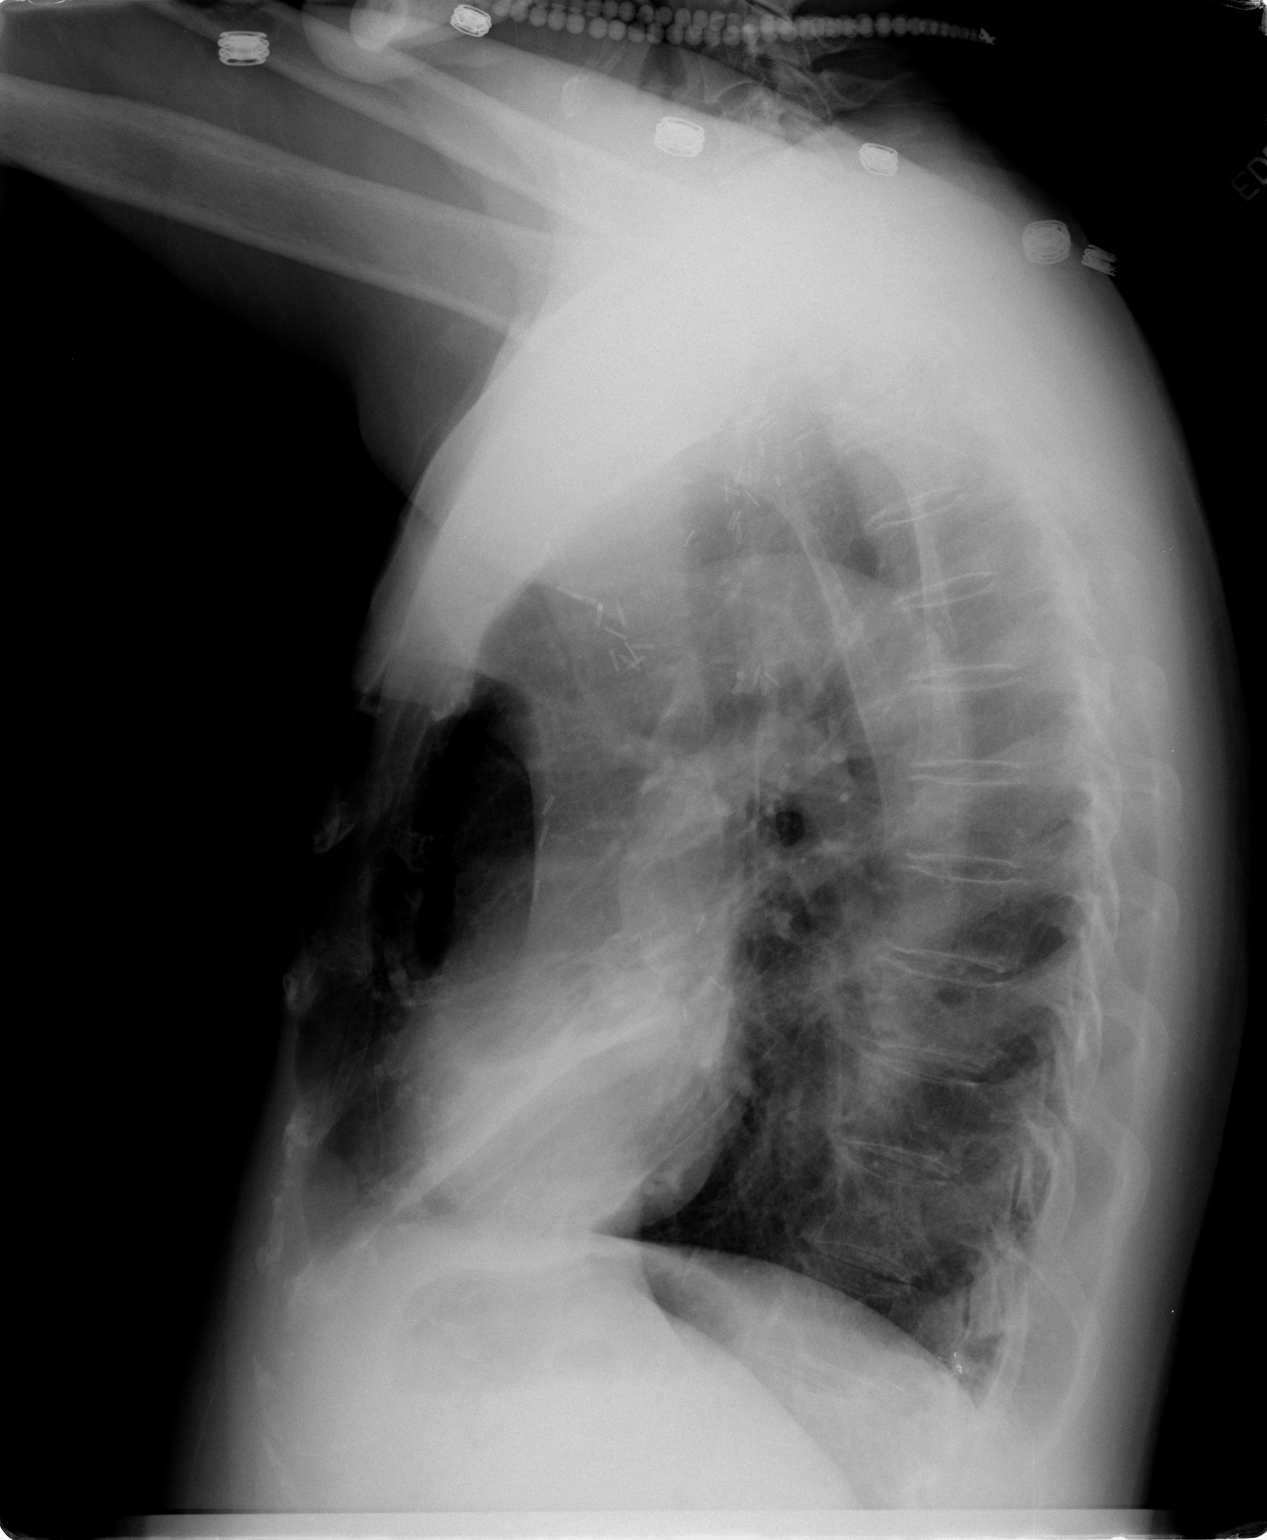

[2 of 2 positions shown; findings below may reference images not displayed]

FINDINGS: .Right middle lobe mass lesion is present with associated
mild volume loss.  This was present on the prior cross-sectional
imaging.  This could be a malignant mass.

Heart is mildly enlarged.  Negative for heart failure.  Negative
for pleural effusion.  Surgical clips in the left axilla
IMPRESSION: Right middle lobe mass lesion

## 2012-10-16 IMAGING — CR DG CHEST 1V PORT
1 series · 1 of 1 positions shown · non-contrast
Comparison: Multiple priors, most recently 03/16/2012.

CLINICAL DATA: Status post thoracotomy.  Evaluate chest tube
placement.

PORTABLE CHEST - 1 VIEW

[AP]
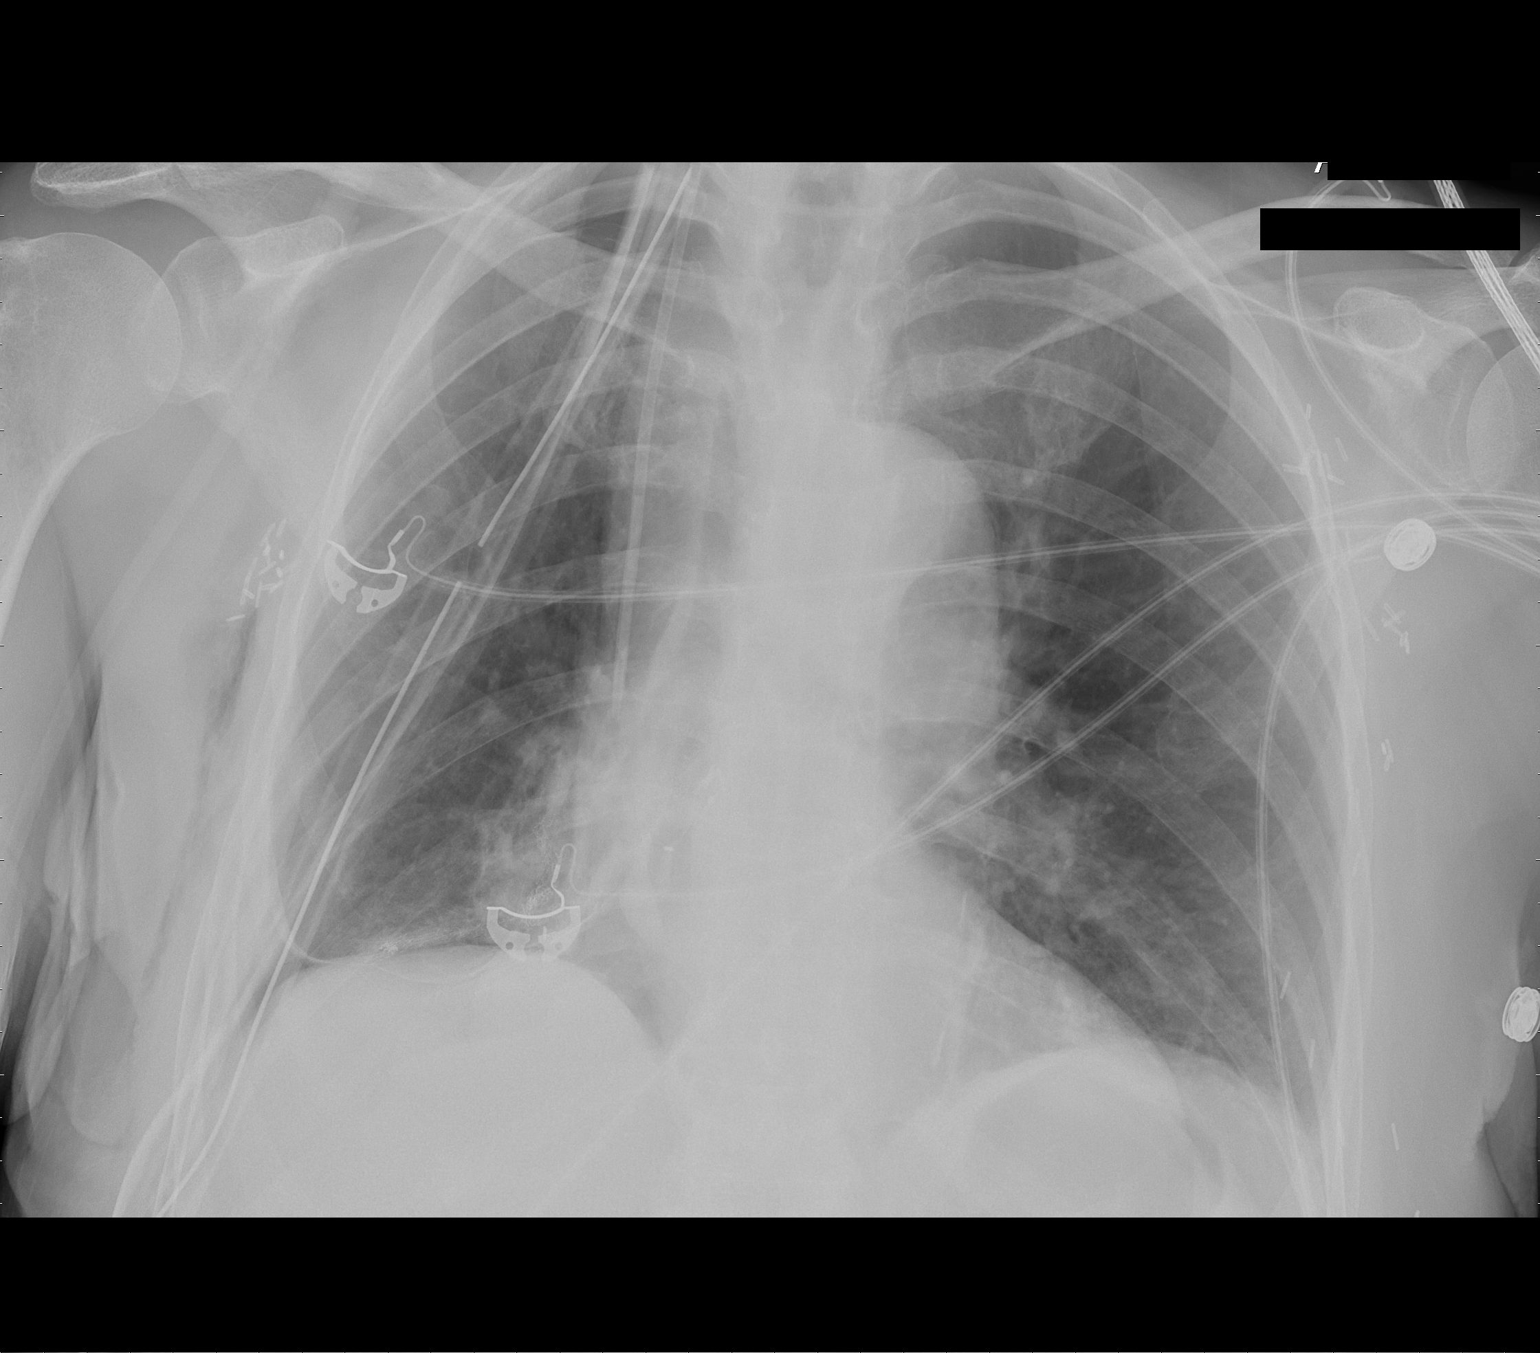

[1 of 1 positions shown; findings below may reference images not displayed]

FINDINGS: Postoperative changes of the right middle and lower
lobectomy are again noted.  There are two right-sided chest tubes
in place, both tips of which extend into the right apex, and side
ports project over the right hemithorax.  There continues to be a
small right-sided pneumothorax, however, this is decreased slightly
compared to yesterday's examination.  There is a right-sided
internal jugular central venous catheter with tip terminating in
the distal superior vena cava. Left lung appears relatively well
aerated, with minimal subsegmental atelectasis in the left base.
No definite pleural effusions.  No evidence of edema.  Heart size
is within normal limits. The patient is rotated to the right on
today's exam, resulting in distortion of the mediastinal contours
and reduced diagnostic sensitivity and specificity for mediastinal
pathology.  Atherosclerosis in the thoracic aorta.  Numerous
surgical clips in the left breast and bilateral axillary regions
again noted.
IMPRESSION: 1.  Support apparatus, as above.
2.  Status post right middle and right lower lobectomy with
decreasing small right-sided pneumothorax.  Otherwise, the
appearance of chest is similar, as above.

## 2012-10-25 ENCOUNTER — Other Ambulatory Visit: Payer: Self-pay | Admitting: *Deleted

## 2012-10-25 DIAGNOSIS — R918 Other nonspecific abnormal finding of lung field: Secondary | ICD-10-CM

## 2012-11-27 ENCOUNTER — Other Ambulatory Visit: Payer: Self-pay

## 2012-11-27 ENCOUNTER — Ambulatory Visit (INDEPENDENT_AMBULATORY_CARE_PROVIDER_SITE_OTHER): Payer: Medicare Other | Admitting: Surgery

## 2012-11-27 ENCOUNTER — Ambulatory Visit
Admission: RE | Admit: 2012-11-27 | Discharge: 2012-11-27 | Disposition: A | Discharge status: Home / Self Care | Payer: Medicare Other | Source: Ambulatory Visit | Attending: Surgery | Admitting: Surgery

## 2012-11-27 ENCOUNTER — Encounter: Payer: Self-pay | Admitting: Surgery

## 2012-11-27 VITALS — BP 150/74 | HR 64 | Resp 20 | Ht 66.0 in | Wt 138.0 lb

## 2012-11-27 DIAGNOSIS — Z9889 Other specified postprocedural states: Secondary | ICD-10-CM

## 2012-11-27 DIAGNOSIS — R918 Other nonspecific abnormal finding of lung field: Secondary | ICD-10-CM

## 2012-11-27 DIAGNOSIS — Z09 Encounter for follow-up examination after completed treatment for conditions other than malignant neoplasm: Secondary | ICD-10-CM

## 2012-11-27 DIAGNOSIS — Z902 Acquired absence of lung [part of]: Secondary | ICD-10-CM

## 2012-11-27 DIAGNOSIS — C342 Malignant neoplasm of middle lobe, bronchus or lung: Secondary | ICD-10-CM

## 2012-11-27 DIAGNOSIS — D381 Neoplasm of uncertain behavior of trachea, bronchus and lung: Secondary | ICD-10-CM

## 2012-11-28 ENCOUNTER — Encounter: Payer: Self-pay | Admitting: Surgery

## 2012-11-28 NOTE — Progress Notes (Signed)
[address]            [name] [address]          [phone]      HPI:  The patient returns today for followup status post right thoracotomy with right middle and lower lobectomy on 03/14/2012 for a T2A, N0 well-differentiated neuroendocrine tumor. She's doing well. She complains of mild dyspnea when walking any significant distance. She has had no chest pain. She denies any cough, sputum production, or hemoptysis. Her weight has been stable. Appetite is good. She denies any headache or visual changes. She had a followup CT scan of the chest today.      Current Outpatient Prescriptions  Medication Sig Dispense Refill  . aspirin 81 MG tablet Take 81 mg by mouth daily.       . carvedilol (COREG) 6.25 MG tablet Take 1 tablet (6.25 mg total) by mouth 2 (two) times daily with a meal.  180 tablet  3  . ferrous gluconate (FERGON) 325 MG tablet Take 325 mg by mouth daily with breakfast.      . nitroGLYCERIN (NITROSTAT) 0.4 MG SL tablet Place 0.4 mg under the tongue every 5 (five) minutes as needed. For chest pains up to 3 doses. If pain still persist call emergency personal.       . pantoprazole (PROTONIX) 40 MG tablet Take 40 mg by mouth daily.      . simvastatin (ZOCOR) 40 MG tablet Take 1 tablet (40 mg total) by mouth at bedtime.  90 tablet  3     Physical Exam: BP 150/74  Pulse 64  Resp 20  Ht 5\' 6"  (1.676 m)  Wt 138 lb (62.596 kg)  BMI 22.27 kg/m2  SpO2 98% She looks well. There is no cervical or supraclavicular adenopathy. Lung exam is clear. The right thoracotomy scar looks fine. There are no skin lesions or tenderness.  Cardiac exam shows a regular rate and rhythm with normal heart sounds. Abdominal exam shows active bowel sounds. Her abdomen is soft, nontender, and without masses or organomegaly.  Diagnostic Tests:  *RADIOLOGY REPORT*   Clinical Data: Follow-up lung cancer.  Right lung resection May 2013.   CT CHEST WITHOUT  CONTRAST 11/28/11   Technique:  Multidetector CT imaging of the chest was performed following the standard protocol without IV contrast.   Comparison: PET CT scan 01/30/2012,  CT thorax 01/17/2012   Findings: Interval resection of the right upper lobe lobular mass. No evidence of nodularity along the resection margin.  There is a linear thickening at the resection margin as expected.  Small right effusion is present.  Left lung is clear.   There is no axillary lymphadenopathy.  There are bilateral lymphadenectomy clips.  Post mastectomy anatomy.  No supraclavicular adenopathy.  No mediastinal adenopathy.   Limited view of the upper abdomen demonstrates enlargement of the right adrenal gland to 3.9 x 2.6 cm.  This is increased from 3.4 x 2.1 cm on comparison PET CT scan.  At that time and the lesion was hypermetabolic with SUV max = 5.1.   IMPRESSION:   1.  No evidence of local lung cancer recurrence at resection margin in the right upper lobe.   2. Small right effusion.   3.  Unfortunately the right adrenal gland lesion has increased in size.  This was hypermetabolic on comparison PET CT. Differential would include  metastasis from a lung primary (neuroendocrine), less likely metastasis from remote breast cancer, or  primary adrenal neoplasm (including adrenal cortical carcinoma or pheochromocytoma).  Recommend correlation with biochemical studies and CT guided biopsy.   Findings discussed with Dr. Laneta Simmers on 11/27/2012.     Original Report Authenticated By: Genevive Bi, M.D.     *RADIOLOGY REPORT*   Clinical Data: Hemoptysis; left-sided chest pain and shortness of breath.   CT ANGIOGRAPHY CHEST 01/17/12   Technique:  Multidetector CT imaging of the chest using the standard protocol during bolus administration of intravenous contrast. Multiplanar reconstructed images including MIPs were obtained and reviewed to evaluate the vascular anatomy.   Contrast: 80mL  OMNIPAQUE IOHEXOL 350 MG/ML IV SOLN   Comparison: Chest radiograph performed earlier today at 03:51 a.m.   Findings: There is no evidence of pulmonary embolus.   There is a multilobulated soft tissue mass noted at and below the right hilum, with extension peripherally along the right major fissure.  Given extension along the fissure, the mass measures approximately 10 cm in length, and approximately 2.9 x 2.3 cm near the right hilum.   The appearance raises concern for bronchogenic malignancy.  Some of the extensions appear to fill and expand distal bronchioles.  No additional masses are identified.  The focal density overlying the left lung base on prior chest radiograph is not well characterized on CT, but may have reflected costochondral calcification.   Mild bibasilar atelectasis is noted; minimal nodularity at the lung bases likely reflects atelectasis.  There is no evidence of pleural effusion or pneumothorax.   Scattered calcification is noted along the descending thoracic aorta.  Dense calcification is noted along the coronary arteries. The mediastinum is otherwise unremarkable in appearance.  Scattered mediastinal nodes remain borderline normal in size; no mediastinal lymphadenopathy is seen.  Though the mass arises at the right hilum, no separate hilar nodes are identified.   No pericardial effusion is identified.  The great vessels are grossly unremarkable in appearance.  No axillary lymphadenopathy is seen.  The thyroid gland is unremarkable in appearance.  Scattered clips are noted at both axilla.   The visualized portions of the liver and spleen are unremarkable. A somewhat complex 3.3 cm mass is noted at the right adrenal gland. This contains a central focus of increased attenuation, and is nonspecific in appearance.  Scattered diverticulosis is noted along the visualized portions of the colon.   No acute osseous abnormalities are seen.   IMPRESSION:   1.   No evidence of pulmonary embolus. 2.  Multilobulated soft tissue mass at and below the right hilum, extending peripherally along the right major fissure.  Given extension along the fissure, the mass measures approximately 10 cm in length, and 2.9 x 2.3 cm near the right hilum.  This is concerning for bronchogenic malignancy; the mass appears to fill and expand distal bronchioles. 3.  Mild bibasilar atelectasis noted. 4.  Dense calcification along the coronary arteries. 5.  Somewhat complex 3.3 cm mass of the right adrenal gland, with a central focus of increased attenuation; this is nonspecific in appearance.  Metastatic disease cannot be entirely excluded. 6.  Scattered diverticulosis along the visualized portions of the colon.   These results were called by telephone on 01/17/2012  at  07:02 a.m. to  Dr. Eber Hong, who verbally acknowledged these results.   Original Report Authenticated By: Tonia Ghent, M.D.    *RADIOLOGY REPORT*   Clinical Data: Initial treatment strategy for right lung mass.  NUCLEAR MEDICINE PET SKULL BASE TO THIGH 01/30/12   Fasting Blood Glucose:  64   Technique:  18.3 mCi F-18 FDG was injected intravenously. CT data was obtained and used for attenuation correction and anatomic localization only.  (This was not acquired as a diagnostic CT examination.) Additional exam technical data entered on technologist worksheet.   Comparison:  CT chest dated 01/17/2012   Findings:   Neck: No hypermetabolic lymph nodes in the neck.   Chest:  Branching opacity may reflect soft tissue/mucus plugging within a segmental right middle lobe bronchus.   Centrally, there is a 2.9 x 2.5 cm nodular opacity with associated mild hypermetabolism along its lateral/peripheral margin, max SUV 4.3. No hypermetabolic mediastinal or hilar nodes.   Abdomen/Pelvis:  No abnormal hypermetabolic activity within the liver, pancreas, adrenal glands, or spleen.  No  hypermetabolic lymph nodes in the abdomen or pelvis.   [place]:  No focal hypermetabolic activity to suggest skeletal metastasis.   IMPRESSION: Branching soft tissue opacity along  a segmental right middle lobe bronchus, with central 2.9 x 2.5 cm nodular opacity.  Associated mild hypermetabolism along its lateral/peripheral margin, max SUV 4.3.   This appearance is indeterminate, and could still reflect a benign lesion with postobstructive infection/inflammation, but tumor is not excluded.  Consider bronchoscopy with tissue sampling for further characterization.   Original Report Authenticated By: Charline Bills, M.D.   Impression:  She is doing well clinically following lung cancer resection on 03/14/2012. The radiologist called me today to discuss the results of her CT scan of the chest which showed the right adrenal gland lesion has increased in size to 3.9 x 2.6 cm compared to her preoperative scan where the right adrenal measured 3.4 x 2.1 cm. It was noted on her original CT scan from 01/17/2012 that the right adrenal gland was enlarged but the PET scan was read as no hypermetabolic activity within the right adrenal gland. Therefore we decided to follow this without a preoperative biopsy. In retrospect, the radiologist today notes that the right adrenal gland was hypermetabolic on the PET scan of 01/30/12 with an SUV of 5.1 which is certainly of more concern. Since the right adrenal gland has increased in size on the CT scan today compared to March 2013 I think we need to proceed with a CT guided biopsy to determine if this is metastatic lung cancer or breast cancer or some other malignant adrenal tumor or a benign lesion. I discussed all this with the patient and her son and they're in agreement with proceeding ahead with biopsy.   Plan:  I will schedule a CT guided biopsy of the right adrenal gland and will see her back afterwards to discuss the results with her and decide on a  course of action.

## 2012-11-29 ENCOUNTER — Other Ambulatory Visit: Payer: Self-pay | Admitting: Radiology

## 2012-11-30 ENCOUNTER — Encounter (HOSPITAL_COMMUNITY): Payer: Self-pay | Admitting: Pharmacy Technician

## 2012-11-30 ENCOUNTER — Other Ambulatory Visit: Payer: Self-pay | Admitting: Radiology

## 2012-12-03 ENCOUNTER — Ambulatory Visit (HOSPITAL_COMMUNITY)
Admission: RE | Admit: 2012-12-03 | Discharge: 2012-12-03 | Disposition: A | Discharge status: Home / Self Care | Payer: Medicare Other | Source: Ambulatory Visit | Attending: Surgery | Admitting: Surgery

## 2012-12-03 DIAGNOSIS — Z85118 Personal history of other malignant neoplasm of bronchus and lung: Secondary | ICD-10-CM | POA: Insufficient documentation

## 2012-12-03 DIAGNOSIS — D381 Neoplasm of uncertain behavior of trachea, bronchus and lung: Secondary | ICD-10-CM

## 2012-12-03 DIAGNOSIS — Z853 Personal history of malignant neoplasm of breast: Secondary | ICD-10-CM | POA: Insufficient documentation

## 2012-12-03 DIAGNOSIS — Z79899 Other long term (current) drug therapy: Secondary | ICD-10-CM | POA: Insufficient documentation

## 2012-12-03 DIAGNOSIS — I10 Essential (primary) hypertension: Secondary | ICD-10-CM | POA: Insufficient documentation

## 2012-12-03 DIAGNOSIS — E279 Disorder of adrenal gland, unspecified: Secondary | ICD-10-CM | POA: Insufficient documentation

## 2012-12-03 LAB — CBC
Hemoglobin: 11.9 g/dL — ABNORMAL LOW (ref 12.0–15.0)
MCH: 29.5 pg (ref 26.0–34.0)
MCV: 88.1 fL (ref 78.0–100.0)
RBC: 4.03 MIL/uL (ref 3.87–5.11)
WBC: 5.5 10*3/uL (ref 4.0–10.5)

## 2012-12-03 MED ORDER — SODIUM CHLORIDE 0.9 % IV SOLN
Freq: Once | INTRAVENOUS | Status: AC
  Administered 2012-12-03: 10:00:00 via INTRAVENOUS

## 2012-12-03 MED ORDER — FENTANYL CITRATE 0.05 MG/ML IJ SOLN
INTRAMUSCULAR | Status: AC
  Filled 2012-12-03: qty 4

## 2012-12-03 MED ORDER — MIDAZOLAM HCL 2 MG/2ML IJ SOLN
INTRAMUSCULAR | Status: AC | PRN
  Administered 2012-12-03: 0.5 mg via INTRAVENOUS
  Administered 2012-12-03: 1 mg via INTRAVENOUS
  Administered 2012-12-03: 0.5 mg via INTRAVENOUS

## 2012-12-03 MED ORDER — MIDAZOLAM HCL 2 MG/2ML IJ SOLN
INTRAMUSCULAR | Status: AC
  Filled 2012-12-03: qty 4

## 2012-12-03 MED ORDER — FENTANYL CITRATE 0.05 MG/ML IJ SOLN
INTRAMUSCULAR | Status: AC | PRN
  Administered 2012-12-03: 25 ug via INTRAVENOUS

## 2012-12-03 NOTE — Procedures (Signed)
Technically successful CT guided biopsy of right adrenal gland biopsy.  No immediate post procedural complications.

## 2012-12-03 NOTE — H&P (Signed)
[name] is an 79 y.o. female.   Chief Complaint: "I'm here for a biopsy" HPI: Patient with remote history of breast cancer , recent right middle and lower lobectomies for neuroendocrine tumor and an enlarging, hypermetabolic right adrenal mass. She presents today for CT guided biopsy of the right adrenal mass.  Past Medical History  Diagnosis Date  . Hypertension   . Dyslipidemia   . CAD (coronary artery disease)     a. 05/2011 [facility] MI;  b. 05/2011 Cath/PCI RCA - DES;  c. 05/2011 Staged PCI LCX, residual mod LAD dzs;  d. 2012 nonischemic Myoveiw, EF 67%.  . Myocardial infarction     05/2011 DR CO[facility] )  . Heart murmur   . Shortness of breath   . Headache   . Breast cancer   . Cancer of eye   . Lung cancer     a. 2013 bronchoscopy - right middle lobe mass + for carcinoid  . Arthritis     Past Surgical History  Procedure Date  . Mastectomy 1990    Bilateral  . Eye surgery   . Video bronchoscopy 02/14/2012    Procedure: VIDEO BRONCHOSCOPY WITH FLUORO;  Surgeon: Barbaraann Share, MD;  Location: [facility];  Service: Cardiopulmonary;  Laterality: Bilateral;  . Mass excision     NECK MASS REMOVED (BENIGN)  . Cardiac catheterization     2012 X 2 PROC DR STUCKEY+ DR COOPER  . Flexible bronchoscopy 03/14/2012    Procedure: FLEXIBLE BRONCHOSCOPY;  Surgeon: [name], MD;  Location: [facility] OR;  Service: Thoracic;  Laterality: N/A;    Family History  Problem Relation Age of Onset  . Coronary artery disease Neg Hx     Premature  . Emphysema Father   . Heart disease Brother   . Heart disease Son    Social History:  reports that she quit smoking about 24 years ago. Her smoking use included Cigarettes. She has a 25 pack-year smoking history. She has never used smokeless tobacco. She reports that she does not drink alcohol or use illicit drugs.  Allergies:  Allergies  Allergen Reactions  . Penicillins Hives    Current outpatient prescriptions:aspirin 81 MG tablet,  Take 81 mg by mouth daily. , Disp: , Rfl: ;  carvedilol (COREG) 6.25 MG tablet, Take 1 tablet (6.25 mg total) by mouth 2 (two) times daily with a meal., Disp: 180 tablet, Rfl: 3;  ferrous gluconate (FERGON) 325 MG tablet, Take 325 mg by mouth daily with breakfast., Disp: , Rfl:  nitroGLYCERIN (NITROSTAT) 0.4 MG SL tablet, Place 0.4 mg under the tongue every 5 (five) minutes as needed. For chest pains up to 3 doses. If pain still persist call emergency personal. , Disp: , Rfl: ;  pantoprazole (PROTONIX) 40 MG tablet, Take 40 mg by mouth daily., Disp: , Rfl: ;  simvastatin (ZOCOR) 40 MG tablet, Take 1 tablet (40 mg total) by mouth at bedtime., Disp: 90 tablet, Rfl: 3  Results for orders placed in visit on 07/09/12  LIPID PANEL      Component Value Range   Cholesterol 136  0 - 200 mg/dL   Triglycerides 21.3  0.0 - 149.0 mg/dL   HDL 08.65 (*) >78.46 mg/dL   VLDL 96.2  0.0 - 95.2 mg/dL   LDL Cholesterol 841 (*) 0 - 99 mg/dL   Total CHOL/HDL Ratio 8    HEPATIC FUNCTION PANEL      Component Value Range   Total Bilirubin 0.1 (*)  0.3 - 1.2 mg/dL   Bilirubin, Direct 0.0  0.0 - 0.3 mg/dL   Alkaline Phosphatase 76  39 - 117 U/L   AST 22  0 - 37 U/L   ALT 13  0 - 35 U/L   Total Protein 8.2  6.0 - 8.3 g/dL   Albumin 3.4 (*) 3.5 - 5.2 g/dL   Results for orders placed during the hospital encounter of 12/03/12  CBC      Component Value Range   WBC 5.5  4.0 - 10.5 K/uL   RBC 4.03  3.87 - 5.11 MIL/uL   Hemoglobin 11.9 (*) 12.0 - 15.0 g/dL   HCT 57.8 (*) 46.9 - 62.9 %   MCV 88.1  78.0 - 100.0 fL   MCH 29.5  26.0 - 34.0 pg   MCHC 33.5  30.0 - 36.0 g/dL   RDW 52.8  41.3 - 24.4 %   Platelets 192  150 - 400 K/uL  12/03/12  PT/PTT pending Review of Systems  Constitutional: Negative for fever and chills.       Occ hot flashes  HENT:       Occ HA's  Respiratory: Negative for cough and shortness of breath.   Cardiovascular: Negative for chest pain.       No hx tachycardia  Gastrointestinal: Negative for  nausea, vomiting, abdominal pain and diarrhea.  Musculoskeletal: Negative for back pain.  Endo/Heme/Allergies: Does not bruise/bleed easily.    Blood pressure 148/82, pulse 71, temperature 96.8 F (36 C), temperature source Oral, resp. rate 18, height 5\' 5"  (1.651 m), weight 131 lb (59.421 kg), SpO2 96.00%. Physical Exam  Constitutional: She is oriented to person, place, and time. She appears well-developed and well-nourished.  Cardiovascular: Normal rate and regular rhythm.   Respiratory: Effort normal and breath sounds normal.  GI: Soft. Bowel sounds are normal. There is no tenderness.  Musculoskeletal: Normal range of motion. She exhibits no edema.  Neurological: She is alert and oriented to person, place, and time.     Assessment/Plan Pt with remote hx of breast cancer, neuroendocrine tumor of right lung, s/p right middle and lower lobectomies 02/2012; now with enlarging, hypermetabolic right adrenal mass. Plan is for CT guided biopsy of the right adrenal mass. Details/risks of procedure d/w pt/son with their understanding and consent.  Quaniya Damas,D KEVIN 12/03/2012, 10:22 AM

## 2012-12-03 NOTE — ED Notes (Signed)
Taken to nurses station.

## 2012-12-03 NOTE — H&P (Signed)
Agree with PA note.  Adrenal mass is likely metastatic given hx of prior lung NET and more remote hx of breast cancer.  Pt denies hx of intermittent symptoms of flushing, diarrhea, tachycardia which would suggest a possible pheochromocytoma.   Signed,  Sterling Big, MD Vascular & Interventional Radiologist [facility]

## 2012-12-05 ENCOUNTER — Ambulatory Visit (INDEPENDENT_AMBULATORY_CARE_PROVIDER_SITE_OTHER): Payer: Medicare Other | Admitting: Surgery

## 2012-12-05 ENCOUNTER — Encounter: Payer: Self-pay | Admitting: Surgery

## 2012-12-05 VITALS — BP 150/80 | HR 69 | Resp 18 | Ht 65.0 in | Wt 131.0 lb

## 2012-12-05 DIAGNOSIS — Z9889 Other specified postprocedural states: Secondary | ICD-10-CM

## 2012-12-05 DIAGNOSIS — R918 Other nonspecific abnormal finding of lung field: Secondary | ICD-10-CM

## 2012-12-05 DIAGNOSIS — R222 Localized swelling, mass and lump, trunk: Secondary | ICD-10-CM

## 2012-12-05 NOTE — Progress Notes (Signed)
                    [address]            [name] [address]          [phone]     HPI:  The patient returns today for followup of her right adrenal biopsy which was done under CT guidance by interventional radiology 2 days ago. The final pathology shows benign adrenal gland tissue and possible hemangioma but no evidence of malignancy.  Current Outpatient Prescriptions  Medication Sig Dispense Refill  . aspirin 81 MG tablet Take 81 mg by mouth daily.       . carvedilol (COREG) 6.25 MG tablet Take 1 tablet (6.25 mg total) by mouth 2 (two) times daily with a meal.  180 tablet  3  . ferrous gluconate (FERGON) 325 MG tablet Take 325 mg by mouth daily with breakfast.      . nitroGLYCERIN (NITROSTAT) 0.4 MG SL tablet Place 0.4 mg under the tongue every 5 (five) minutes as needed. For chest pains up to 3 doses. If pain still persist call emergency personal.       . pantoprazole (PROTONIX) 40 MG tablet Take 40 mg by mouth daily.      . simvastatin (ZOCOR) 40 MG tablet Take 1 tablet (40 mg total) by mouth at bedtime.  90 tablet  3     Physical Exam: BP 150/80  Pulse 69  Resp 18  Ht 5\' 5"  (1.651 m)  Wt 131 lb (59.421 kg)  BMI 21.80 kg/m2  SpO2 96% She looks well. Lung exam is clear. Abdominal exam shows active bowel sounds. Abdomen is soft and nontender.  Diagnostic Tests:  FINAL DIAGNOSIS Diagnosis Adrenal gland, biopsy, Right - SMALL FRAGMENT OF BENIGN ADRENAL GLAND TISSUE AND ADJACENT BENIGN VASCULAR PROLIFERATION. - NO EVIDENCE OF NEUROENDOCRINE TUMOR OR MALIGNANCY. - PLEASE SEE COMMENT. Microscopic Comment The biopsies are composed of small fragment of benign adrenal gland and adjacent benign vascular proliferation; the morphologic features are mostly consistent with hemangioma. There is no evidence of atypia, neuroendocrine tumor of malignancy identified. Clinical and radiographic correlation is highly recommended. (HCL:caf 12/04/12) Abigail Miyamoto  MD Pathologist, Electronic Signature (Case signed 12/04/2012) Specimen Gross and Clinical Information Specimen(s) Obtained: Adrenal gland, biopsy, Right Specimen Clinical  Impression:  She has an enlarged right adrenal gland that was increased in size on her preoperative CT scan and has subsequently increased further in size on recent CT scan compared to her preoperative CT scan. Biopsy shows no evidence of malignancy. I discussed the results with her and her sister and the need for ongoing followup of this as well as of her lung cancer. The adrenal gland enlargement is of significant size and I think the biopsy is probably accurate. I doubt that this is a false negative result.  Plan:  I'll plan to see her back in 6 months and we'll do a CT scan the chest and abdomen at that time.

## 2013-03-01 IMAGING — CR DG CHEST 2V
2 series · 2 of 2 positions shown · non-contrast
Comparison: 05/01/2012

CLINICAL DATA: Follow up pleural effusion

CHEST - 2 VIEW

[w chest pa]
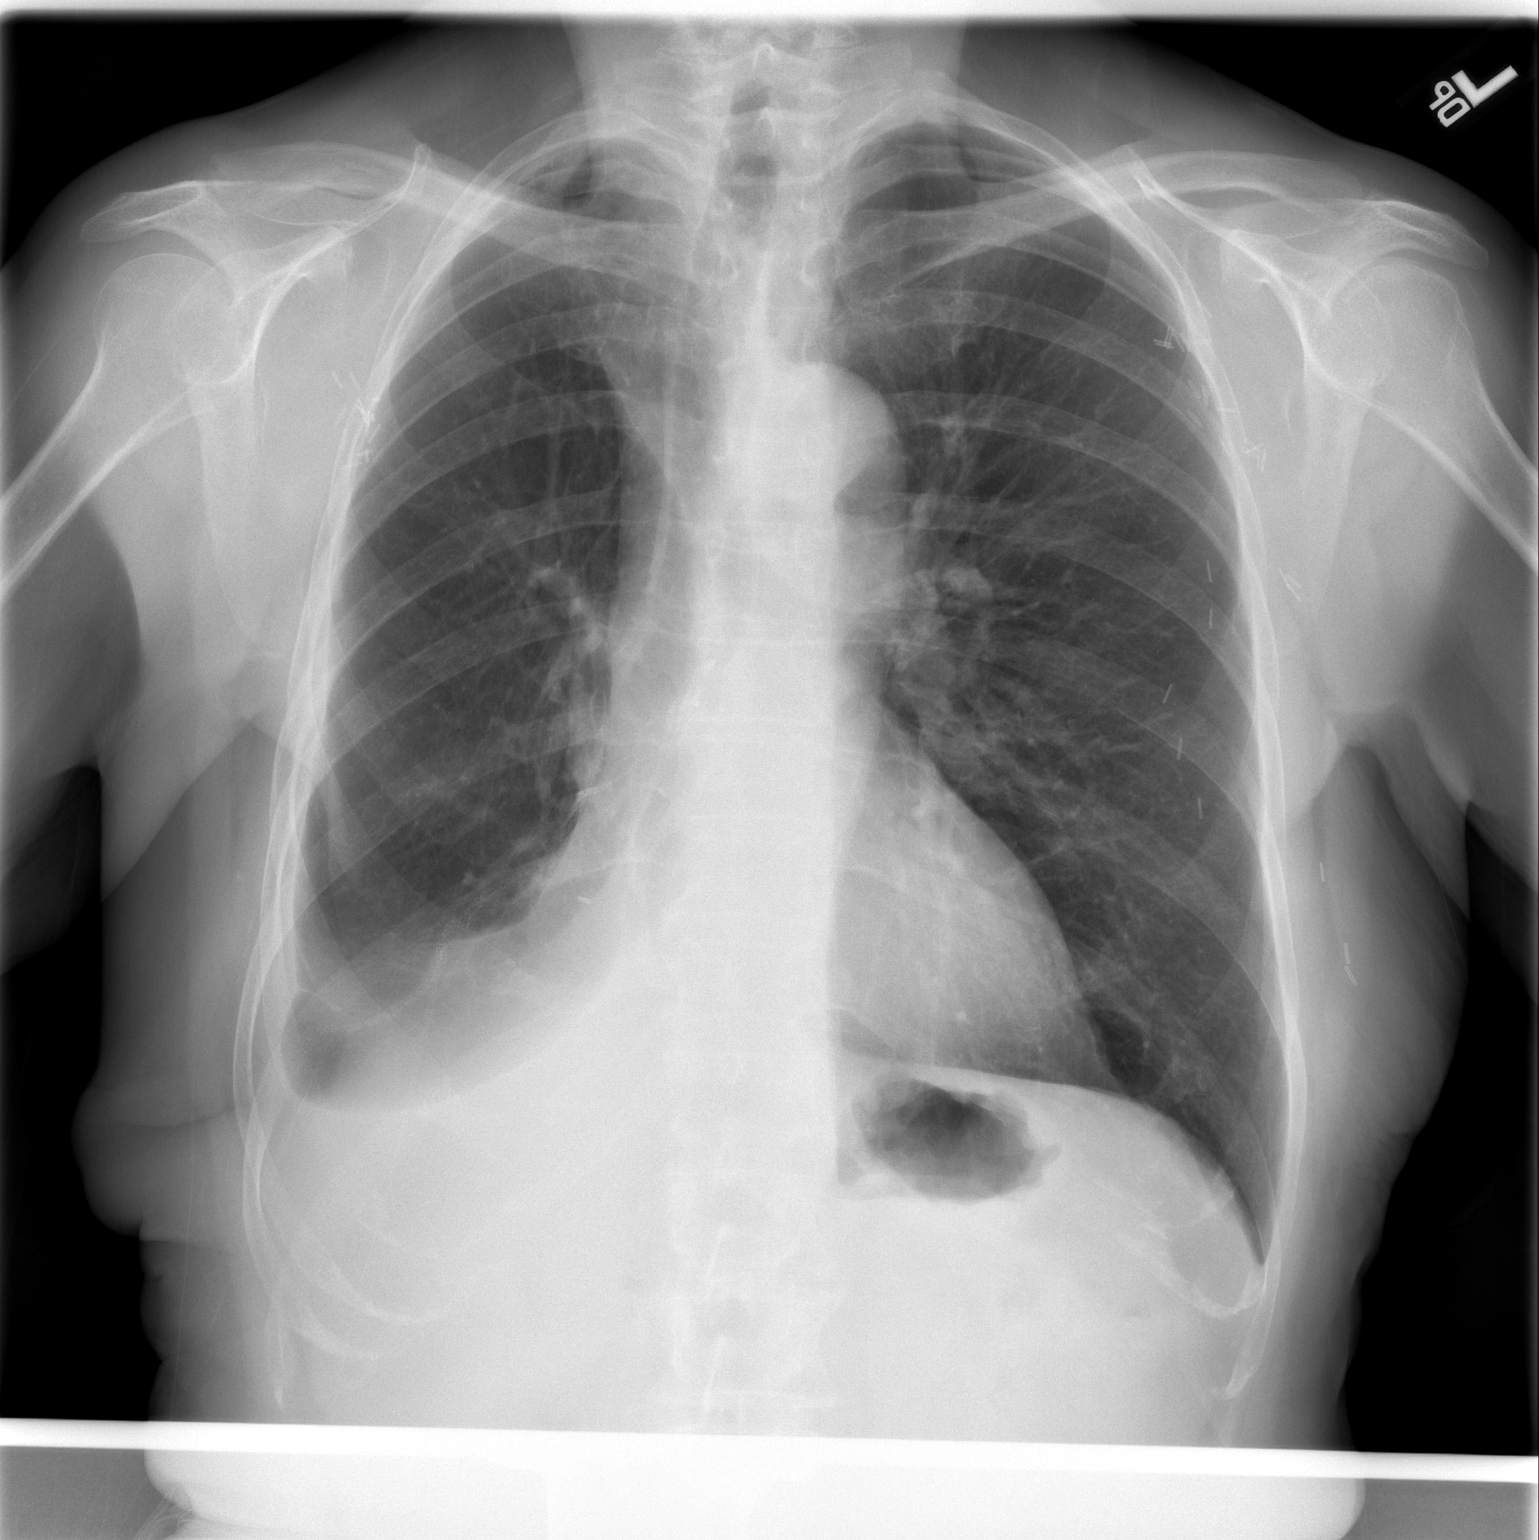

[w chest lat]
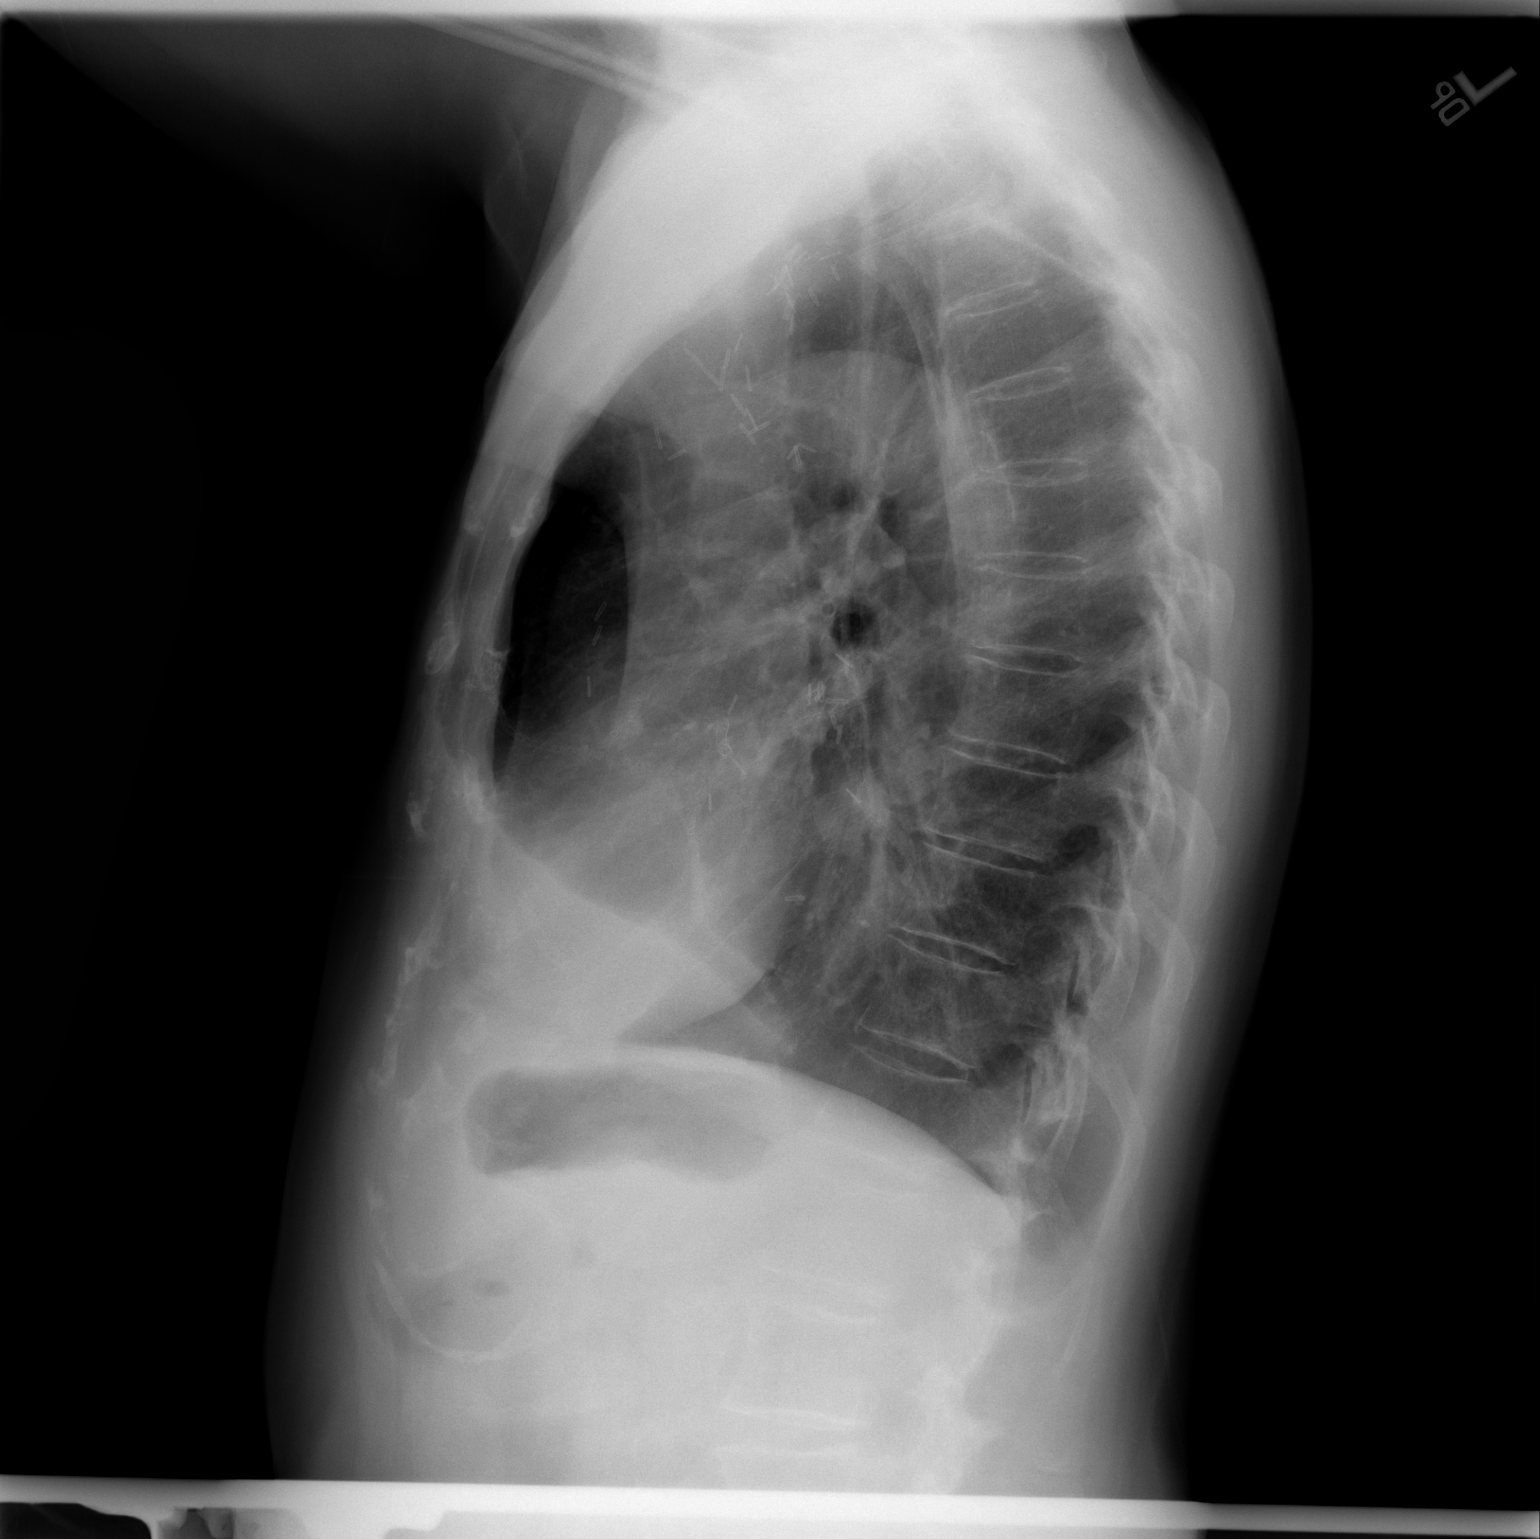

[2 of 2 positions shown; findings below may reference images not displayed]

FINDINGS: Cardiomediastinal silhouette is stable.  Stable small
right pleural effusion with right basilar atelectasis or scarring.
Small right anterior hydropneumothorax best seen on lateral view.
No pulmonary edema.  Surgical clips bilateral axilla again noted.
IMPRESSION: Stable small right pleural effusion with right basilar atelectasis
or scarring.  Small right anterior hydropneumothorax best seen on
lateral view.  No pulmonary edema.  Surgical clips bilateral axilla
again noted.]

## 2013-04-03 ENCOUNTER — Other Ambulatory Visit: Payer: Self-pay

## 2013-04-24 ENCOUNTER — Ambulatory Visit: Payer: Medicare Other | Admitting: Surgery

## 2013-06-03 ENCOUNTER — Other Ambulatory Visit: Payer: Self-pay | Admitting: *Deleted

## 2013-06-03 DIAGNOSIS — Q891 Congenital malformations of adrenal gland: Secondary | ICD-10-CM

## 2013-06-03 DIAGNOSIS — C3490 Malignant neoplasm of unspecified part of unspecified bronchus or lung: Secondary | ICD-10-CM

## 2013-06-19 ENCOUNTER — Other Ambulatory Visit: Payer: Medicare Other

## 2013-06-19 ENCOUNTER — Ambulatory Visit: Payer: Medicare Other | Admitting: Surgery

## 2013-06-25 ENCOUNTER — Other Ambulatory Visit: Payer: Self-pay | Admitting: Cardiovascular Disease

## 2013-06-28 ENCOUNTER — Emergency Department (HOSPITAL_COMMUNITY): Payer: Medicare Other

## 2013-06-28 ENCOUNTER — Inpatient Hospital Stay (HOSPITAL_COMMUNITY)
Admission: EM | Admit: 2013-06-28 | Discharge: 2013-06-29 | DRG: 596 | Disposition: A | Discharge status: Home / Self Care | Payer: Medicare Other | Attending: Internal Medicine | Admitting: Internal Medicine

## 2013-06-28 ENCOUNTER — Encounter (HOSPITAL_COMMUNITY): Payer: Self-pay | Admitting: Emergency Medicine

## 2013-06-28 DIAGNOSIS — D259 Leiomyoma of uterus, unspecified: Secondary | ICD-10-CM | POA: Diagnosis present

## 2013-06-28 DIAGNOSIS — Z85118 Personal history of other malignant neoplasm of bronchus and lung: Secondary | ICD-10-CM

## 2013-06-28 DIAGNOSIS — C3490 Malignant neoplasm of unspecified part of unspecified bronchus or lung: Secondary | ICD-10-CM | POA: Diagnosis present

## 2013-06-28 DIAGNOSIS — Z853 Personal history of malignant neoplasm of breast: Secondary | ICD-10-CM

## 2013-06-28 DIAGNOSIS — J449 Chronic obstructive pulmonary disease, unspecified: Secondary | ICD-10-CM | POA: Diagnosis present

## 2013-06-28 DIAGNOSIS — I10 Essential (primary) hypertension: Secondary | ICD-10-CM

## 2013-06-28 DIAGNOSIS — Z87891 Personal history of nicotine dependence: Secondary | ICD-10-CM

## 2013-06-28 DIAGNOSIS — Z902 Acquired absence of lung [part of]: Secondary | ICD-10-CM

## 2013-06-28 DIAGNOSIS — B029 Zoster without complications: Principal | ICD-10-CM

## 2013-06-28 DIAGNOSIS — Z85840 Personal history of malignant neoplasm of eye: Secondary | ICD-10-CM

## 2013-06-28 DIAGNOSIS — E785 Hyperlipidemia, unspecified: Secondary | ICD-10-CM | POA: Diagnosis present

## 2013-06-28 DIAGNOSIS — R112 Nausea with vomiting, unspecified: Secondary | ICD-10-CM

## 2013-06-28 DIAGNOSIS — I2510 Atherosclerotic heart disease of native coronary artery without angina pectoris: Secondary | ICD-10-CM

## 2013-06-28 DIAGNOSIS — R1031 Right lower quadrant pain: Secondary | ICD-10-CM

## 2013-06-28 DIAGNOSIS — M899 Disorder of bone, unspecified: Secondary | ICD-10-CM | POA: Diagnosis present

## 2013-06-28 DIAGNOSIS — I252 Old myocardial infarction: Secondary | ICD-10-CM

## 2013-06-28 DIAGNOSIS — Z9861 Coronary angioplasty status: Secondary | ICD-10-CM

## 2013-06-28 DIAGNOSIS — J4489 Other specified chronic obstructive pulmonary disease: Secondary | ICD-10-CM | POA: Diagnosis present

## 2013-06-28 HISTORY — DX: Major depressive disorder, single episode, unspecified: F32.9

## 2013-06-28 HISTORY — DX: Depression, unspecified: F32.A

## 2013-06-28 LAB — URINALYSIS, ROUTINE W REFLEX MICROSCOPIC
Glucose, UA: NEGATIVE mg/dL
Ketones, ur: NEGATIVE mg/dL
Leukocytes, UA: NEGATIVE
Nitrite: NEGATIVE
Specific Gravity, Urine: 1.011 (ref 1.005–1.030)
pH: 8 (ref 5.0–8.0)

## 2013-06-28 LAB — CBC
Hemoglobin: 13.2 g/dL (ref 12.0–15.0)
Platelets: 172 10*3/uL (ref 150–400)
RBC: 4.31 MIL/uL (ref 3.87–5.11)
WBC: 4.3 10*3/uL (ref 4.0–10.5)

## 2013-06-28 LAB — CG4 I-STAT (LACTIC ACID): Lactic Acid, Venous: 1.23 mmol/L (ref 0.5–2.2)

## 2013-06-28 LAB — URINE MICROSCOPIC-ADD ON

## 2013-06-28 LAB — POCT I-STAT, CHEM 8
Calcium, Ion: 1.17 mmol/L (ref 1.13–1.30)
Chloride: 102 mEq/L (ref 96–112)
HCT: 39 % (ref 36.0–46.0)
Potassium: 3.4 mEq/L — ABNORMAL LOW (ref 3.5–5.1)
Sodium: 139 mEq/L (ref 135–145)

## 2013-06-28 MED ORDER — LISINOPRIL 5 MG PO TABS
5.0000 mg | ORAL_TABLET | Freq: Every day | ORAL | Status: DC
  Administered 2013-06-28: 5 mg via ORAL
  Filled 2013-06-28 (×2): qty 1

## 2013-06-28 MED ORDER — CARVEDILOL 6.25 MG PO TABS
6.2500 mg | ORAL_TABLET | Freq: Two times a day (BID) | ORAL | Status: DC
  Administered 2013-06-28 – 2013-06-29 (×3): 6.25 mg via ORAL
  Filled 2013-06-28 (×4): qty 1

## 2013-06-28 MED ORDER — ONDANSETRON HCL 4 MG/2ML IJ SOLN
4.0000 mg | Freq: Once | INTRAMUSCULAR | Status: AC
  Administered 2013-06-28: 4 mg via INTRAVENOUS
  Filled 2013-06-28: qty 2

## 2013-06-28 MED ORDER — NITROGLYCERIN 0.4 MG SL SUBL: 0.4000 mg | SUBLINGUAL_TABLET | SUBLINGUAL | Status: DC | PRN

## 2013-06-28 MED ORDER — SODIUM CHLORIDE 0.9 % IJ SOLN
3.0000 mL | Freq: Two times a day (BID) | INTRAMUSCULAR | Status: DC
  Administered 2013-06-29: 3 mL via INTRAVENOUS

## 2013-06-28 MED ORDER — POTASSIUM CHLORIDE 20 MEQ/15ML (10%) PO LIQD
10.0000 meq | Freq: Once | ORAL | Status: AC
  Administered 2013-06-28: 10 meq via ORAL
  Filled 2013-06-28: qty 15

## 2013-06-28 MED ORDER — SODIUM CHLORIDE 0.9 % IV SOLN
INTRAVENOUS | Status: DC
  Administered 2013-06-28: 15:00:00 via INTRAVENOUS

## 2013-06-28 MED ORDER — ACETAMINOPHEN 500 MG PO TABS
500.0000 mg | ORAL_TABLET | Freq: Three times a day (TID) | ORAL | Status: DC | PRN
  Filled 2013-06-28: qty 1

## 2013-06-28 MED ORDER — MORPHINE SULFATE 4 MG/ML IJ SOLN
4.0000 mg | Freq: Once | INTRAMUSCULAR | Status: AC
  Administered 2013-06-28: 4 mg via INTRAVENOUS
  Filled 2013-06-28: qty 1

## 2013-06-28 MED ORDER — VALACYCLOVIR HCL 500 MG PO TABS
500.0000 mg | ORAL_TABLET | Freq: Three times a day (TID) | ORAL | Status: DC
  Administered 2013-06-28 – 2013-06-29 (×4): 500 mg via ORAL
  Filled 2013-06-28 (×5): qty 1

## 2013-06-28 MED ORDER — SODIUM CHLORIDE 0.9 % IV BOLUS (SEPSIS)
1000.0000 mL | Freq: Once | INTRAVENOUS | Status: AC
  Administered 2013-06-28: 1000 mL via INTRAVENOUS

## 2013-06-28 MED ORDER — SIMVASTATIN 40 MG PO TABS
40.0000 mg | ORAL_TABLET | Freq: Every day | ORAL | Status: DC
  Administered 2013-06-28: 40 mg via ORAL
  Filled 2013-06-28 (×2): qty 1

## 2013-06-28 MED ORDER — HEPARIN SODIUM (PORCINE) 5000 UNIT/ML IJ SOLN
5000.0000 [IU] | Freq: Three times a day (TID) | INTRAMUSCULAR | Status: DC
  Administered 2013-06-28 – 2013-06-29 (×4): 5000 [IU] via SUBCUTANEOUS
  Filled 2013-06-28 (×6): qty 1

## 2013-06-28 MED ORDER — IOHEXOL 300 MG/ML  SOLN
80.0000 mL | Freq: Once | INTRAMUSCULAR | Status: AC | PRN
  Administered 2013-06-28: 80 mL via INTRAVENOUS

## 2013-06-28 MED ORDER — CLINDAMYCIN PHOSPHATE 300 MG/50ML IV SOLN
300.0000 mg | Freq: Once | INTRAVENOUS | Status: AC
  Administered 2013-06-28: 300 mg via INTRAVENOUS
  Filled 2013-06-28 (×2): qty 50

## 2013-06-28 MED ORDER — SODIUM CHLORIDE 0.9 % IJ SOLN: 3.0000 mL | INTRAMUSCULAR | Status: DC | PRN

## 2013-06-28 MED ORDER — POTASSIUM CHLORIDE 10 MEQ/100ML IV SOLN: 10.0000 meq | Freq: Once | INTRAVENOUS | Status: DC

## 2013-06-28 MED ORDER — POTASSIUM CHLORIDE CRYS ER 20 MEQ PO TBCR
40.0000 meq | EXTENDED_RELEASE_TABLET | Freq: Once | ORAL | Status: AC
  Administered 2013-06-28: 40 meq via ORAL
  Filled 2013-06-28: qty 2

## 2013-06-28 MED ORDER — PANTOPRAZOLE SODIUM 40 MG IV SOLR
40.0000 mg | INTRAVENOUS | Status: DC
  Administered 2013-06-28: 40 mg via INTRAVENOUS
  Filled 2013-06-28 (×2): qty 40

## 2013-06-28 MED ORDER — HYDROCODONE-ACETAMINOPHEN 7.5-325 MG/15ML PO SOLN: 10.0000 mL | Freq: Four times a day (QID) | ORAL | Status: DC | PRN

## 2013-06-28 MED ORDER — ASPIRIN EC 81 MG PO TBEC
81.0000 mg | DELAYED_RELEASE_TABLET | Freq: Every day | ORAL | Status: DC
  Administered 2013-06-28 – 2013-06-29 (×2): 81 mg via ORAL
  Filled 2013-06-28 (×2): qty 1

## 2013-06-28 MED ORDER — ONDANSETRON 4 MG PO TBDP: 4.0000 mg | ORAL_TABLET | Freq: Once | ORAL | Status: DC

## 2013-06-28 MED ORDER — ONDANSETRON HCL 4 MG/2ML IJ SOLN: 4.0000 mg | Freq: Three times a day (TID) | INTRAMUSCULAR | Status: DC | PRN

## 2013-06-28 MED ORDER — SODIUM CHLORIDE 0.9 % IJ SOLN
3.0000 mL | Freq: Two times a day (BID) | INTRAMUSCULAR | Status: DC
  Administered 2013-06-28 – 2013-06-29 (×2): 3 mL via INTRAVENOUS

## 2013-06-28 MED ORDER — SODIUM CHLORIDE 0.9 % IV SOLN: 250.0000 mL | INTRAVENOUS | Status: DC | PRN

## 2013-06-28 NOTE — ED Notes (Signed)
Pt was recently tx'd at [facility] for bleeding ulcer, pt denies hematemesis in the last 24 hours.

## 2013-06-28 NOTE — H&P (Signed)
Date: 06/28/2013               Patient Name:  [name] MRN: [id number]  DOB: 08-13-1934 Age / Sex: 79 y.o., female   PCP: Tonny Bollman, MD         Medical Service: Internal Medicine Teaching Service         Attending Physician: Dr. Blanch Media    First Contact: Dr. Evelena Peat Pager: [phone]  Second Contact: Dr. Lorretta Harp Pager: [phone]       After Hours (After 5p/  First Contact Pager: [phone]  weekends / holidays): Second Contact Pager: [phone]   Chief Complaint: nausea/vomiting and right hip pain  History of Present Illness: [name] is a 79 year old female with a PMH of CAD (s/p MI with 2 stent placement in 2012), HTN, HLD, breast CA and carcinoid lung CA (s/p lobectomy) and COPD.  She lives independently in [place], [place] and was in her usual state of health up until nine days ago.  At that time she began experiencing N/V and R hip pain.  She went to an urgent care center and was diagnosed with a "kidney infection" (more likely UTI) and started on Bactrim q12h x 7 days.  However, she misinterpreted the label and has been taking it once daily.  She reports that xrays done at urgent care were only significant for hip arthritis.  [name] continued to have symptoms of right hip pain, N/V, dysuria and frequency so she went to the ED the day after visiting urgent care.  She was able to see her PCP four days ago and at that time she received a steroid injection in the R hip.  The next morning she noticed significant pain when her clothing brushed up against her hip.  When she looked at the hip there was a new red rash with tiny white bumps.  She says the area has been itchy and painful.     [name] came to [facility] yesterday in order to housesit for her son.  She has continued to have right hip pain, urinary frequency and had several episodes of vomiting last night so her son brought her to the ED today.  Her son was present for the entire H&P and helped  provide some of the history.   In the ED:  T97.83F, RR20, HR85, BP204/103, SpO2 98%; UA, CT abd/pelvis and R hip 2 view xray completed; 1L NSS, 300mg  IV clindamycin, 4mg  morphine (x2), 4 mg Zofran (x3), 10 mEq K given  Meds: Current Facility-Administered Medications  Medication Dose Route Frequency Provider Last Rate Last Dose  . 0.9 %  sodium chloride infusion   Intravenous Continuous Lorretta Harp, MD        Allergies: Allergies as of 06/28/2013 - Review Complete 06/28/2013  Allergen Reaction Noted  . Penicillins Hives 05/18/2011  . Oxycodone  06/28/2013   Past Medical History  Diagnosis Date  . Hypertension   . Dyslipidemia   . CAD (coronary artery disease)     a. 05/2011 [facility] MI;  b. 05/2011 Cath/PCI RCA - DES;  c. 05/2011 Staged PCI LCX, residual mod LAD dzs;  d. 2012 nonischemic Myoveiw, EF 67%.  . Myocardial infarction     05/2011 DR COOPER [facility] )  . Heart murmur   . Shortness of breath   . Headache(784.0)   . Breast cancer   . Cancer of eye   . Lung cancer     a. 2013 bronchoscopy -  right middle lobe mass + for carcinoid  . Arthritis    Past Surgical History  Procedure Laterality Date  . Mastectomy  1990    Bilateral  . Eye surgery    . Video bronchoscopy  02/14/2012    Procedure: VIDEO BRONCHOSCOPY WITH FLUORO;  Surgeon: Barbaraann Share, MD;  Location: [facility];  Service: Cardiopulmonary;  Laterality: Bilateral;  . Mass excision      NECK MASS REMOVED (BENIGN)  . Cardiac catheterization      2012 X 2 PROC DR STUCKEY+ DR COOPER  . Flexible bronchoscopy  03/14/2012    Procedure: FLEXIBLE BRONCHOSCOPY;  Surgeon: [name], MD;  Location: [facility];  Service: Thoracic;  Laterality: N/A;   Family History  Problem Relation Age of Onset  . Coronary artery disease Neg Hx     Premature  . Emphysema Father   . Heart disease Brother   . Heart disease Son    History   Social History  . Marital Status: Widowed    Spouse Name: N/A    Number of Children: Y  .  Years of Education: N/A   Occupational History  . Retired from a [employer]    Social History Main Topics  . Smoking status: Former Smoker -- 1.00 packs/day for 25 years    Types: Cigarettes    Quit date: 10/31/1988  . Smokeless tobacco: Never Used  . Alcohol Use: No  . Drug Use: No  . Sexual Activity: Not on file   Other Topics Concern  . Not on file   Social History Narrative   Lives independently in [place], [place].          Review of Systems: Pertinent items are noted in HPI.  Physical Exam:  Blood pressure 190/91, pulse 70, temperature 97.1 F (36.2 C), temperature source Oral, resp. rate 16, SpO2 94.00%. General: resting in bed in NAD, non-toxic appearing HEENT: PERRL, EOMI, oropharynx w/o erythema or exudate Cardiac: RRR, no rubs, murmurs or gallops Pulm: clear to auscultation bilaterally, moving normal volumes of air Abd: soft, nondistended, BS present, lower abdominal/pelvic area mildly tender to palpation, no CVAT GU:  Small, erythematous papules in the pubic area; no suprapubic tenderness Ext: warm and well perfused, no pedal edema, +2 DPs MSK:  Full ROM of R and L hip; right hip tender to palpation Skin:  Erythematous area with small papules on the skin of the R hip. Neuro: alert and oriented X3, cranial nerves II-XII grossly intact, 5/5 MMS  Lab results: Basic Metabolic Panel:  Recent Labs  78/29/56 0623  NA 139  K 3.4*  CL 102  GLUCOSE 127*  BUN 12  CREATININE 0.70   Liver Function Tests: No results found for this basename: AST, ALT, ALKPHOS, BILITOT, PROT, ALBUMIN,  in the last 72 hours No results found for this basename: LIPASE, AMYLASE,  in the last 72 hours No results found for this basename: AMMONIA,  in the last 72 hours CBC:  Recent Labs  06/28/13 0614 06/28/13 0623  WBC 4.3  --   HGB 13.2 13.3  HCT 36.8 39.0  MCV 85.4  --   PLT 172  --    Cardiac Enzymes: No results found for this basename: CKTOTAL, CKMB, CKMBINDEX,  TROPONINI,  in the last 72 hours BNP: No results found for this basename: PROBNP,  in the last 72 hours D-Dimer: No results found for this basename: DDIMER,  in the last 72 hours CBG: No results found for this basename:  GLUCAP,  in the last 72 hours Hemoglobin A1C: No results found for this basename: HGBA1C,  in the last 72 hours Fasting Lipid Panel: No results found for this basename: CHOL, HDL, LDLCALC, TRIG, CHOLHDL, LDLDIRECT,  in the last 72 hours Thyroid Function Tests: No results found for this basename: TSH, T4TOTAL, FREET4, T3FREE, THYROIDAB,  in the last 72 hours Anemia Panel: No results found for this basename: VITAMINB12, FOLATE, FERRITIN, TIBC, IRON, RETICCTPCT,  in the last 72 hours Coagulation: No results found for this basename: LABPROT, INR,  in the last 72 hours Urine Drug Screen: Drugs of Abuse  No results found for this basename: labopia,  cocainscrnur,  labbenz,  amphetmu,  thcu,  labbarb    Alcohol Level: No results found for this basename: ETH,  in the last 72 hours Urinalysis:  Recent Labs  06/28/13 0630  COLORURINE YELLOW  LABSPEC 1.011  PHURINE 8.0  GLUCOSEU NEGATIVE  HGBUR SMALL*  BILIRUBINUR NEGATIVE  KETONESUR NEGATIVE  PROTEINUR 30*  UROBILINOGEN 0.2  NITRITE NEGATIVE  LEUKOCYTESUR NEGATIVE   Imaging results:  Dg Hip Complete Right  06/28/2013   *RADIOLOGY REPORT*  Clinical Data: Nausea, right hip pain  RIGHT HIP - COMPLETE 2+ VIEW  Comparison: CT scan same day  Findings: Three views of the right hip submitted.  No acute fracture or subluxation.  Spurring of the greater femoral trochanter.  Joint space is  preserved.  There is diffuse osteopenia.  Distended urinary bladder is noted with contrast material from recent CT scan.  IMPRESSION: Diffuse osteopenia.  Mild degenerative changes.  No acute fracture or subluxation.  Distended urinary bladder.   Original Report Authenticated By: Natasha Mead, M.D.   Ct Abdomen Pelvis W Contrast  06/28/2013    *RADIOLOGY REPORT*  Clinical Data: Nausea, vomiting, urinary frequency, history of lung and breast cancer, kidney infection  CT ABDOMEN AND PELVIS WITH CONTRAST  Technique:  Multidetector CT imaging of the abdomen and pelvis was performed following the standard protocol during bolus administration of intravenous contrast.  Contrast: 80mL OMNIPAQUE IOHEXOL 300 MG/ML  SOLN  Comparison: CT of the chest 11/27/2012 and CT scan/PET scan 01/30/2012  Findings: Sagittal images of the spine shows no destructive bony lesions.  Significant disc space flattening with endplate sclerotic changes mild anterior and mild posterior spurring noted at L5 S1 level.  There is a small amount of oral  contrast material in distal esophagus probable from gastroesophageal reflux disease. Visualized lung bases are unremarkable.  Enhanced liver shows no biliary ductal dilatation.  No focal hepatic mass.  Atherosclerotic calcifications of the abdominal aorta and the iliac arteries are noted.  No aortic aneurysm.  The pancreas, spleen and left adrenal gland are unremarkable. Again noted heterogeneous right adrenal gland  nodular lesion. This measures 3.7 x 3.1 cm, stable in size from prior exam.  This lesion was biopsied in the 12/03/2012.  Please correlate with biopsy results.  Kidneys are symmetrical in size and enhancement. No hydronephrosis or hydroureter.  Again noted slight malrotated right kidney.  A tiny cyst is noted in mid pole posterior aspect of the left kidney measures 5 mm.  There is probable cortical scarring lower pole of the right kidney medially.  A tiny cyst in mid pole of the right kidney anteriorly measures 4 mm.  There is a tiny cyst in the lower pole of the right kidney laterally measures 3 mm.  Delayed renal images shows bilateral renal symmetrical excretion. Visualized proximal ureter is unremarkable bilaterally.  Oral contrast  material was given to the patient.  There is no small bowel obstruction.  Left colon diverticula  are noted.  No evidence of acute diverticulitis. No ascites or free air.  Scattered diverticula are noted transverse colon.  No acute diverticulitis.  Significant stool noted within cecum.  No pericecal inflammation.  The terminal ileum is unremarkable.  There is a distended urinary bladder.  No bladder filling defects are noted.  Again noted nodular contour of the uterus.  The uterus measures 7.6 by 4.9 cm.  A calcified fibroid in the right fundus measures 7.2 mm.  There is a second calcified fibroid right uterine body measures about 1 cm.  A heterogeneous nodular fibroid in the left uterine fundus measures 4.3 cm.  This is unchanged in size from 01/30/2012.  Correlation with GYN exam is recommended as clinically warranted.  Multiple sigmoid colon diverticula are noted.  No evidence of acute diverticulitis.  Moderate distention of the rectum with gas up to 9 cm.  No destructive bony lesions are noted within pelvis.  Mild degenerative changes bilateral SI joints.  Coronal images shows mild degenerative changes bilateral hip joints.  IMPRESSION:  1.  Small amount of contrast material noted in the distal esophagus probable from gastroesophageal reflux 2.  Again noted heterogeneous nodular lesion right adrenal gland. This is stable in size from prior exam.  Please correlate with biopsy results from 12/03/2012 3.  No acute inflammatory process within abdomen or pelvis. 4.  No hydronephrosis or hydroureter.  Small bilateral renal cysts 5.  Colonic diverticula are noted.  No evidence of acute diverticulitis. 6.  Again noted mild enlarged uterus with nodular appearance.  A dominant left fundal fibroid measures 4.3 cm stable in size from prior exam.  Additional smaller calcified fibroids are noted within uterus.  Correlation with GYN exam is recommended as clinically warranted. 7.  Degenerative changes lumbar spine at L5 S1 level. 8.  Moderate distended urinary bladder. Moderate gaseous distention of the rectum .   Original  Report Authenticated By: Natasha Mead, M.D.   Other results: EKG: normal sinus rhythm  Assessment & Plan by Problem: 79 year old female with a PMH of CAD (s/p MI with 2 stent placement in 2012), HTN, HLD, breast CA and carcinoid lung CA (s/p lobectomy) and COPD presenting with a nine day history of N/V and right hip pain.  #Right hip pain - OA vs.Trochanteric bursitis vs. Shingles.  The patient has full ROM and xray of hip negative for fracture.  No anterior hip or groin pain.  Pain preceding the appearance of a pruritic rash is concerning for shingles.  The patient has never received Zoster Vaccine.   - place on airborne/contact precautions - start Valtrex 500mg  TID - hydrocodone-acetaminophen for pain  #Nausea/vomiting - UTI vs. Pyelonephritis are high on the differential given the patient's symptoms of frequency and dysuria and recent treatment for UTI.  However, UA is negative for infection in the ED so this infection has resolved.  Pyelonephritis is unlikely since the patient is afebrile, and has no leukocytosis or CVAT.  Gastroenteritis is less likely as the patient has had these symptoms for over one week and has not had any diarrhea.  Gastric outlet obstruction (patient has PUD) is also on the differential but patient lacks epigastric pain. - admit to IMTS on telemetry - NSS 75cc/hr - NPO - Zofran  #CAD - s/p MI (with 2 stents placed 2012); patient denies chest pain, troponin negative, EKG without evidence of ischemia -  will continue Coreg 6.25mg  BID, ASA 81mg   #Hypertension - pressures elevated (high of 204/158mmHg) in ED - continue Coreg 6.25mg  BID - start Lisinopril 5mg  daily  #HLD - continue simvastatin 40mg  qHS  #PUD - Protonix  #DVT ppx - SQ heparin  Dispo: Disposition is deferred at this time, awaiting improvement of current medical problems. Anticipated discharge in approximately 1-2 day(s).   The patient does have a current PCP Tonny Bollman, MD) and does need an [facility]  hospital follow-up appointment after discharge.  The patient does not have transportation limitations that hinder transportation to clinic appointments.  Signed: Evelena Peat, DO 06/28/2013, 2:04 PM

## 2013-06-28 NOTE — ED Notes (Signed)
Pt currently able to tolerate PO fluids.

## 2013-06-28 NOTE — ED Notes (Signed)
Pt attempting PO challenge

## 2013-06-28 NOTE — ED Notes (Signed)
Pt in x-ray at this time

## 2013-06-28 NOTE — ED Notes (Signed)
Greta Doom, PA verified for patient to take her own BP medication. Given Carvedilol 6.25 mg at this time from patient's own medication bottle.

## 2013-06-28 NOTE — ED Provider Notes (Signed)
CSN: [id number]     Arrival date & time 06/28/13  0536 History   First MD Initiated Contact with Patient 06/28/13 0602     Chief Complaint  Patient presents with  . Nausea  . Urinary Frequency   (Consider location/radiation/quality/duration/timing/severity/associated sxs/prior Treatment) HPI  79 year old female with history of CAD, hypertension, prior breast cancer presents for evaluations of dysuria. History obtained through patient and her son who is at bedside. Patient states she has had urinary frequency and right flank pain for more than a week. Symptoms also associated with nausea and vomiting. She was seen a week ago and was diagnosed with having her urine tract infection. She was discharged with clindamycin. Patient was supposed to take the medication twice daily but had mistakenly taken once daily for the past week. States the symptom seems to improve for a few days but was yesterday she has had increased nausea, vomited 3 times of nonbilious nonbloody content, and having worsening right flank pain which radiates to the right lower abdomen. Pain is achy in nature worsened when she lays on the affected side. She is unable to keep anything down. No complaints of fever, chills, chest pain, shortness of breath, hematuria, or rash. Her last bowel movement was yesterday, she has been constipated since. Patient has not been able to keep the medication down. Patient was also complaining of right hip pain for the same duration. No specific trauma. Was giving a cortisone shot a week ago when she was diagnosed with having UTI as well. States the medication has not helped. She also noticed a rash overlying the injection site. Patient is at home by herself. Her doctors in [place].  Past Medical History  Diagnosis Date  . Hypertension   . Dyslipidemia   . CAD (coronary artery disease)     a. 05/2011 [facility] MI;  b. 05/2011 Cath/PCI RCA - DES;  c. 05/2011 Staged PCI LCX, residual mod LAD dzs;  d.  2012 nonischemic Myoveiw, EF 67%.  . Myocardial infarction     05/2011 DR COOPER [facility] )  . Heart murmur   . Shortness of breath   . Headache(784.0)   . Breast cancer   . Cancer of eye   . Lung cancer     a. 2013 bronchoscopy - right middle lobe mass + for carcinoid  . Arthritis    Past Surgical History  Procedure Laterality Date  . Mastectomy  1990    Bilateral  . Eye surgery    . Video bronchoscopy  02/14/2012    Procedure: VIDEO BRONCHOSCOPY WITH FLUORO;  Surgeon: Barbaraann Share, MD;  Location: [facility];  Service: Cardiopulmonary;  Laterality: Bilateral;  . Mass excision      NECK MASS REMOVED (BENIGN)  . Cardiac catheterization      2012 X 2 PROC DR STUCKEY+ DR COOPER  . Flexible bronchoscopy  03/14/2012    Procedure: FLEXIBLE BRONCHOSCOPY;  Surgeon: [name], MD;  Location: [facility] OR;  Service: Thoracic;  Laterality: N/A;   Family History  Problem Relation Age of Onset  . Coronary artery disease Neg Hx     Premature  . Emphysema Father   . Heart disease Brother   . Heart disease Son    History  Substance Use Topics  . Smoking status: Former Smoker -- 1.00 packs/day for 25 years    Types: Cigarettes    Quit date: 10/31/1988  . Smokeless tobacco: Never Used  . Alcohol Use: No   OB History  Grav Para Term Preterm Abortions TAB SAB Ect Mult Living                 Review of Systems  All other systems reviewed and are negative.    Allergies  Penicillins  Home Medications   Current Outpatient Rx  Name  Route  Sig  Dispense  Refill  . acetaminophen (TYLENOL) 500 MG tablet   Oral   Take 500 mg by mouth every 6 (six) hours as needed for pain.         Marland Kitchen aspirin EC 81 MG tablet   Oral   Take 81 mg by mouth daily.         . carvedilol (COREG) 6.25 MG tablet   Oral   Take 6.25 mg by mouth 2 (two) times daily with a meal.         . simvastatin (ZOCOR) 40 MG tablet   Oral   Take 1 tablet (40 mg total) by mouth at bedtime.   90 tablet   3    . sulfamethoxazole-trimethoprim (BACTRIM DS) 800-160 MG per tablet   Oral   Take 1 tablet by mouth 2 (two) times daily.         . nitroGLYCERIN (NITROSTAT) 0.4 MG SL tablet   Sublingual   Place 0.4 mg under the tongue every 5 (five) minutes as needed. For chest pains up to 3 doses. If pain still persist call emergency personal.           BP 204/103  Pulse 85  Temp(Src) 97.1 F (36.2 C) (Oral)  Resp 20  SpO2 98% Physical Exam  Nursing note and vitals reviewed. Constitutional: She is oriented to person, place, and time. She appears well-developed and well-nourished. No distress.  Thin-appearing female, appears her stated age ,uncomfortable but nontoxic.  HENT:  Head: Atraumatic.  Mouth/Throat: Oropharynx is clear and moist.  Eyes: Conjunctivae are normal. Right eye exhibits no discharge. Left eye exhibits no discharge.  Neck: Neck supple.  Cardiovascular: Normal rate and regular rhythm.   Murmur (Mild systolic heart murmur) heard. Pulmonary/Chest: Effort normal. No respiratory distress. She exhibits no tenderness.  Abdominal: Soft. Bowel sounds are normal. She exhibits no distension. There is no tenderness (right lower quadrant tenderness without guarding or rebound tenderness. No hernia noted). There is no rebound.  Genitourinary:  No CVA tenderness.  Musculoskeletal: She exhibits no edema and no tenderness.  ROM appears intact, no obvious focal weakness  R para-Lumbar tenderness on palpation and percussion  Right hip with full range of motion, no deformity, no crepitus.  Small localized skin reaction noted to the right anterolateral aspect of hip at site of injection. No obvious signs of infection. A small blister to be seen overlying rash. No fluctuance  Neurological: She is alert and oriented to person, place, and time.  Mental status and motor strength appears intact  Skin: No rash noted.  Psychiatric: She has a normal mood and affect.    ED Course  Procedures  (including critical care time)   Date: 06/28/2013  Rate: 66  Rhythm: normal sinus rhythm  QRS Axis: normal  Intervals: normal  ST/T Wave abnormalities: normal  Conduction Disutrbances:none  Narrative Interpretation:   Old EKG Reviewed: unchanged    6:29 AM Patient presents with dysuria with associated nausea and vomiting. This could be residual UTI not resolved due to taking a subtherapeutic dose of Bactrim. She also has right lower quadrant abdominal pain although abdomen is nonsurgical. Considering her age, we'll  consider CT scan if the urine is unremarkable. Patient is hypertensive with blood pressure of 204/103. She has not been able to keep her medication down.  9:26 AM Patient has normal lactic acid, normal white count, UA shows no evidence of urinary tract infection and her electrolytes are reassuring. Abdominal and pelvis CT scan shows several abnormalities however in no acute changes to suggest patient's pain. Will obtain x-ray of right hip for further evaluations of her right hip pain.  11:06 AM Patient required multiple doses of pain medication and anti-nausea medication to get her symptoms under control. Although she felt better, patient does not feel comfortable going home due to her sxs. She lives at home by herself. Her primary care doctor is in [place]. Therefore, will have patient admitted for further care.    11:25 AM I have consulted Internal Medicine-unassigned resident who agrees to see pt in ER and will consider admission for symptomatic control and further work up of pt's complaint.  Pt is aware of plan and in agreement.    Labs Review Labs Reviewed  URINALYSIS, ROUTINE W REFLEX MICROSCOPIC - Abnormal; Notable for the following:    APPearance CLOUDY (*)    Hgb urine dipstick SMALL (*)    Protein, ur 30 (*)    All other components within normal limits  POCT I-STAT, CHEM 8 - Abnormal; Notable for the following:    Potassium 3.4 (*)    Glucose, Bld 127  (*)    All other components within normal limits  CBC  URINE MICROSCOPIC-ADD ON  CG4 I-STAT (LACTIC ACID)   Imaging Review Dg Hip Complete Right  06/28/2013   *RADIOLOGY REPORT*  Clinical Data: Nausea, right hip pain  RIGHT HIP - COMPLETE 2+ VIEW  Comparison: CT scan same day  Findings: Three views of the right hip submitted.  No acute fracture or subluxation.  Spurring of the greater femoral trochanter.  Joint space is  preserved.  There is diffuse osteopenia.  Distended urinary bladder is noted with contrast material from recent CT scan.  IMPRESSION: Diffuse osteopenia.  Mild degenerative changes.  No acute fracture or subluxation.  Distended urinary bladder.   Original Report Authenticated By: Natasha Mead, M.D.   Ct Abdomen Pelvis W Contrast  06/28/2013   *RADIOLOGY REPORT*  Clinical Data: Nausea, vomiting, urinary frequency, history of lung and breast cancer, kidney infection  CT ABDOMEN AND PELVIS WITH CONTRAST  Technique:  Multidetector CT imaging of the abdomen and pelvis was performed following the standard protocol during bolus administration of intravenous contrast.  Contrast: 80mL OMNIPAQUE IOHEXOL 300 MG/ML  SOLN  Comparison: CT of the chest 11/27/2012 and CT scan/PET scan 01/30/2012  Findings: Sagittal images of the spine shows no destructive bony lesions.  Significant disc space flattening with endplate sclerotic changes mild anterior and mild posterior spurring noted at L5 S1 level.  There is a small amount of oral  contrast material in distal esophagus probable from gastroesophageal reflux disease. Visualized lung bases are unremarkable.  Enhanced liver shows no biliary ductal dilatation.  No focal hepatic mass.  Atherosclerotic calcifications of the abdominal aorta and the iliac arteries are noted.  No aortic aneurysm.  The pancreas, spleen and left adrenal gland are unremarkable. Again noted heterogeneous right adrenal gland  nodular lesion. This measures 3.7 x 3.1 cm, stable in size from  prior exam.  This lesion was biopsied in the 12/03/2012.  Please correlate with biopsy results.  Kidneys are symmetrical in size and enhancement. No hydronephrosis  or hydroureter.  Again noted slight malrotated right kidney.  A tiny cyst is noted in mid pole posterior aspect of the left kidney measures 5 mm.  There is probable cortical scarring lower pole of the right kidney medially.  A tiny cyst in mid pole of the right kidney anteriorly measures 4 mm.  There is a tiny cyst in the lower pole of the right kidney laterally measures 3 mm.  Delayed renal images shows bilateral renal symmetrical excretion. Visualized proximal ureter is unremarkable bilaterally.  Oral contrast material was given to the patient.  There is no small bowel obstruction.  Left colon diverticula are noted.  No evidence of acute diverticulitis. No ascites or free air.  Scattered diverticula are noted transverse colon.  No acute diverticulitis.  Significant stool noted within cecum.  No pericecal inflammation.  The terminal ileum is unremarkable.  There is a distended urinary bladder.  No bladder filling defects are noted.  Again noted nodular contour of the uterus.  The uterus measures 7.6 by 4.9 cm.  A calcified fibroid in the right fundus measures 7.2 mm.  There is a second calcified fibroid right uterine body measures about 1 cm.  A heterogeneous nodular fibroid in the left uterine fundus measures 4.3 cm.  This is unchanged in size from 01/30/2012.  Correlation with GYN exam is recommended as clinically warranted.  Multiple sigmoid colon diverticula are noted.  No evidence of acute diverticulitis.  Moderate distention of the rectum with gas up to 9 cm.  No destructive bony lesions are noted within pelvis.  Mild degenerative changes bilateral SI joints.  Coronal images shows mild degenerative changes bilateral hip joints.  IMPRESSION:  1.  Small amount of contrast material noted in the distal esophagus probable from gastroesophageal reflux 2.   Again noted heterogeneous nodular lesion right adrenal gland. This is stable in size from prior exam.  Please correlate with biopsy results from 12/03/2012 3.  No acute inflammatory process within abdomen or pelvis. 4.  No hydronephrosis or hydroureter.  Small bilateral renal cysts 5.  Colonic diverticula are noted.  No evidence of acute diverticulitis. 6.  Again noted mild enlarged uterus with nodular appearance.  A dominant left fundal fibroid measures 4.3 cm stable in size from prior exam.  Additional smaller calcified fibroids are noted within uterus.  Correlation with GYN exam is recommended as clinically warranted. 7.  Degenerative changes lumbar spine at L5 S1 level. 8.  Moderate distended urinary bladder. Moderate gaseous distention of the rectum .   Original Report Authenticated By: Natasha Mead, M.D.    MDM   1. Abdominal pain, right lower quadrant   2. Nausea & vomiting     BP 189/69  Pulse 69  Temp(Src) 97.1 F (36.2 C) (Oral)  Resp 16  SpO2 98%  I have reviewed nursing notes and vital signs. I personally reviewed the imaging tests through PACS system  I reviewed available ER/hospitalization records thought the EMR     Fayrene Helper, PA-C 06/28/13 1126  Fayrene Helper, PA-C 06/28/13 1334

## 2013-06-28 NOTE — Progress Notes (Signed)
Admission note:  Arrival Method: from ED Mental Orientation: A&Ox4 Telemetry: yes, box 18 applied Assessment: See doc flowsheets Skin: Generalized bruising, blister rash right hip IV: SL L wrist Pain: 5/10 Rt hip Tubes: N/A Safety Measures: Patient Handbook has been given, and discussed the Fall Prevention worksheet. Left at bedside. Bed alarm on.  Admission: Completed and admission orders have been written  6700 Orientation: Patient has been oriented to the unit, staff and to the room.  Family: At the bedside

## 2013-06-28 NOTE — ED Notes (Addendum)
Per pt, pt has been experiencing n/v, right hip, and urinary frequency x1 week. Pt was seen at an ER in [place], [place] for symptoms, and pt was dx'd with a 'kidney infection' per pt. Pt also had a Right hip X-ray and was told that "it was consistent with arthritis." Pt received a cortisone injection and has not experienced any relief. Pt is still nauseated tonight and has had 3 episodes of vomiting in 24 hours. Pt states she is still urinating frequently, but denies burning, pain or blood in the urine. Pt was prescribed Bactrim for infection and has not been following the regimen as prescribed, pt has been taking one pill per day, but was supposed to be taking 2 pills per day. Pt is a/o x4.

## 2013-06-29 LAB — CBC
HCT: 32.7 % — ABNORMAL LOW (ref 36.0–46.0)
Hemoglobin: 11.4 g/dL — ABNORMAL LOW (ref 12.0–15.0)
MCH: 30.1 pg (ref 26.0–34.0)
MCHC: 34.9 g/dL (ref 30.0–36.0)
RBC: 3.79 MIL/uL — ABNORMAL LOW (ref 3.87–5.11)

## 2013-06-29 LAB — COMPREHENSIVE METABOLIC PANEL
ALT: 11 U/L (ref 0–35)
AST: 21 U/L (ref 0–37)
Albumin: 3 g/dL — ABNORMAL LOW (ref 3.5–5.2)
CO2: 25 mEq/L (ref 19–32)
Calcium: 8.9 mg/dL (ref 8.4–10.5)
Creatinine, Ser: 0.77 mg/dL (ref 0.50–1.10)
Sodium: 136 mEq/L (ref 135–145)
Total Protein: 7.5 g/dL (ref 6.0–8.3)

## 2013-06-29 LAB — CORTISOL-AM, BLOOD: Cortisol - AM: 12.7 ug/dL (ref 4.3–22.4)

## 2013-06-29 MED ORDER — PANTOPRAZOLE SODIUM 40 MG PO TBEC: 40.0000 mg | DELAYED_RELEASE_TABLET | Freq: Every day | ORAL | Status: DC

## 2013-06-29 MED ORDER — HYDROCODONE-ACETAMINOPHEN 7.5-325 MG PO TABS: 1.0000 | ORAL_TABLET | Freq: Four times a day (QID) | ORAL | Status: DC | PRN

## 2013-06-29 MED ORDER — VALACYCLOVIR HCL 1 G PO TABS: 1000.0000 mg | ORAL_TABLET | Freq: Three times a day (TID) | ORAL | Status: DC

## 2013-06-29 NOTE — H&P (Signed)
  Date: 06/29/2013  Patient name: [name]  Medical record number: [id number]  Date of birth: 07-22-1934   I have seen and evaluated [name] and discussed their care with the Residency Team. She had 9 days of a prodrome /systemic sxs of R hip pain, N/V. She now has a classical shingles rash from R hip to pubic area that extends to midline. No other lesions. She will return to her home. One son lives near by and her brother next door. Her pain is tolerable with hydrocodone.  Of note, her plain films showed osteopenia. She has never discussed bone health with her PCP. She has fallen recently. Currently lives alone and is independent in her ADL's.   Assessment and Plan: I have seen and evaluated the patient as outlined above. I agree with the formulated Assessment and Plan as detailed in the residents' admission note, with the following changes:   1. Shingles - Cont the Valtrex (started soon after the lesions appeared) and hydrocodone for pain control.   2. Osteopenia seen on plain film - encouraged her to discuss DEXA with her PCP as a fracture would likely prevent her independent living.   3. N/V - likely 2/2 shingles although a minority of pts get systemic sxs. If able to tolerate PO, may be D/C'd.   Burns Spain, MD 8/30/20141:23 PM

## 2013-06-29 NOTE — Discharge Summary (Signed)
Patient Name:  [name]  MRN: [id number]  PCP: Tonny Bollman, MD  DOB:  02-Jul-1934       Date of Admission:  06/28/2013  Date of Discharge:  06/29/2013      Attending Physician: Dr. Burns Spain, MD         DISCHARGE DIAGNOSES: 1.   Nausea and vomiting 2.   Shingles 3.   Carcinoid tumor of lung 4.   CAD (coronary artery disease) 5.   HTN (hypertension) 6.   Hyperlipidemia 7.   S/P lobectomy of lung 8.   COPD (chronic obstructive pulmonary disease)  DISPOSITION AND FOLLOW-UP: SHEILLA MARIS is to follow-up with the listed providers as detailed below, at patient's visiting, please address following issues:  1. Hip x-ray revealed osteopenia - assess need for DEXA scan. 2. Uterine fibroids - assess need for GYN follow-up.  Follow-up Information   Follow up with Len B Lamarr Lulas.. (Make a hospital follow-up appointment as soon as possible.)    Specialty:  Internal Medicine   Contact information:   [facility] [address] [place] [address]      Discharge Orders   Future Appointments Provider Department Dept Phone   07/03/2013 12:30 PM [facility] [place] [facility] AT [facility] [phone]   Patient to arrive 15 minutes prior to appointment time.   07/03/2013 1:00 PM [facility] [address] [place] [facility] AT [facility] [phone]   Patient to arrive 15 minutes prior to appointment time. Patient to pick up oral contrast at least 1 day prior to exam, unless otherwise instructed by your physician. No solid food 4 hours prior to exam. Liquids and Medicines are okay.   07/03/2013 3:00 PM [name], MD [facility] [place] [phone]   07/09/2013 2:00 PM Tonny Bollman, MD [facility] [place]) [phone]   Future Orders Complete By Expires   Call MD for:  difficulty breathing, headache or visual disturbances  As directed    Call MD for:  extreme fatigue  As directed    Call MD for:   persistant dizziness or light-headedness  As directed    Call MD for:  persistant nausea and vomiting  As directed    Call MD for:  severe uncontrolled pain  As directed    Call MD for:  temperature >100.4  As directed    Diet - low sodium heart healthy  As directed    Increase activity slowly  As directed        DISCHARGE MEDICATIONS:   Medication List    STOP taking these medications       acetaminophen 500 MG tablet  Commonly known as:  TYLENOL     sulfamethoxazole-trimethoprim 800-160 MG per tablet  Commonly known as:  BACTRIM DS      TAKE these medications       aspirin EC 81 MG tablet  Take 81 mg by mouth daily.     carvedilol 6.25 MG tablet  Commonly known as:  COREG  Take 6.25 mg by mouth 2 (two) times daily with a meal.     HYDROcodone-acetaminophen 7.5-325 MG per tablet  Commonly known as:  NORCO  Take 1 tablet by mouth every 6 (six) hours as needed for pain.     nitroGLYCERIN 0.4 MG SL tablet  Commonly known as:  NITROSTAT  Place 0.4 mg under the tongue every 5 (five) minutes as needed. For chest pains up to 3  doses. If pain still persist call emergency personal.     simvastatin 40 MG tablet  Commonly known as:  ZOCOR  Take 1 tablet (40 mg total) by mouth at bedtime.     valACYclovir 1000 MG tablet  Commonly known as:  VALTREX  Take 1 tablet (1,000 mg total) by mouth 3 (three) times daily.         CONSULTS:   None   PROCEDURES PERFORMED:  Dg Hip Complete Right  06/28/2013   *RADIOLOGY REPORT*  Clinical Data: Nausea, right hip pain  RIGHT HIP - COMPLETE 2+ VIEW  Comparison: CT scan same day  Findings: Three views of the right hip submitted.  No acute fracture or subluxation.  Spurring of the greater femoral trochanter.  Joint space is  preserved.  There is diffuse osteopenia.  Distended urinary bladder is noted with contrast material from recent CT scan.  IMPRESSION: Diffuse osteopenia.  Mild degenerative changes.  No acute fracture or subluxation.   Distended urinary bladder.   Original Report Authenticated By: Natasha Mead, M.D.   Ct Abdomen Pelvis W Contrast  06/28/2013   *RADIOLOGY REPORT*  Clinical Data: Nausea, vomiting, urinary frequency, history of lung and breast cancer, kidney infection  CT ABDOMEN AND PELVIS WITH CONTRAST  Technique:  Multidetector CT imaging of the abdomen and pelvis was performed following the standard protocol during bolus administration of intravenous contrast.  Contrast: 80mL OMNIPAQUE IOHEXOL 300 MG/ML  SOLN  Comparison: CT of the chest 11/27/2012 and CT scan/PET scan 01/30/2012  Findings: Sagittal images of the spine shows no destructive bony lesions.  Significant disc space flattening with endplate sclerotic changes mild anterior and mild posterior spurring noted at L5 S1 level.  There is a small amount of oral  contrast material in distal esophagus probable from gastroesophageal reflux disease. Visualized lung bases are unremarkable.  Enhanced liver shows no biliary ductal dilatation.  No focal hepatic mass.  Atherosclerotic calcifications of the abdominal aorta and the iliac arteries are noted.  No aortic aneurysm.  The pancreas, spleen and left adrenal gland are unremarkable. Again noted heterogeneous right adrenal gland  nodular lesion. This measures 3.7 x 3.1 cm, stable in size from prior exam.  This lesion was biopsied in the 12/03/2012.  Please correlate with biopsy results.  Kidneys are symmetrical in size and enhancement. No hydronephrosis or hydroureter.  Again noted slight malrotated right kidney.  A tiny cyst is noted in mid pole posterior aspect of the left kidney measures 5 mm.  There is probable cortical scarring lower pole of the right kidney medially.  A tiny cyst in mid pole of the right kidney anteriorly measures 4 mm.  There is a tiny cyst in the lower pole of the right kidney laterally measures 3 mm.  Delayed renal images shows bilateral renal symmetrical excretion. Visualized proximal ureter is  unremarkable bilaterally.  Oral contrast material was given to the patient.  There is no small bowel obstruction.  Left colon diverticula are noted.  No evidence of acute diverticulitis. No ascites or free air.  Scattered diverticula are noted transverse colon.  No acute diverticulitis.  Significant stool noted within cecum.  No pericecal inflammation.  The terminal ileum is unremarkable.  There is a distended urinary bladder.  No bladder filling defects are noted.  Again noted nodular contour of the uterus.  The uterus measures 7.6 by 4.9 cm.  A calcified fibroid in the right fundus measures 7.2 mm.  There is a second calcified fibroid right uterine  body measures about 1 cm.  A heterogeneous nodular fibroid in the left uterine fundus measures 4.3 cm.  This is unchanged in size from 01/30/2012.  Correlation with GYN exam is recommended as clinically warranted.  Multiple sigmoid colon diverticula are noted.  No evidence of acute diverticulitis.  Moderate distention of the rectum with gas up to 9 cm.  No destructive bony lesions are noted within pelvis.  Mild degenerative changes bilateral SI joints.  Coronal images shows mild degenerative changes bilateral hip joints.  IMPRESSION:  1.  Small amount of contrast material noted in the distal esophagus probable from gastroesophageal reflux 2.  Again noted heterogeneous nodular lesion right adrenal gland. This is stable in size from prior exam.  Please correlate with biopsy results from 12/03/2012 3.  No acute inflammatory process within abdomen or pelvis. 4.  No hydronephrosis or hydroureter.  Small bilateral renal cysts 5.  Colonic diverticula are noted.  No evidence of acute diverticulitis. 6.  Again noted mild enlarged uterus with nodular appearance.  A dominant left fundal fibroid measures 4.3 cm stable in size from prior exam.  Additional smaller calcified fibroids are noted within uterus.  Correlation with GYN exam is recommended as clinically warranted. 7.   Degenerative changes lumbar spine at L5 S1 level. 8.  Moderate distended urinary bladder. Moderate gaseous distention of the rectum .   Original Report Authenticated By: Natasha Mead, M.D.       ADMISSION DATA: H&P: Ms. Raimondi is a 79 year old female with a PMH of CAD (s/p MI with 2 stent placement in 2012), HTN, HLD, breast CA and carcinoid lung CA (s/p lobectomy) and COPD. She lives independently in [place], [place] and was in her usual state of health up until nine days ago. At that time she began experiencing N/V and R hip pain. She went to an urgent care center and was diagnosed with a "kidney infection" (more likely UTI) and started on Bactrim q12h x 7 days. However, she misinterpreted the label and has been taking it once daily. She reports that xrays done at urgent care were only significant for hip arthritis. Ms. Lordi continued to have symptoms of right hip pain, N/V, dysuria and frequency so she went to the ED the day after visiting urgent care. She was able to see her PCP four days ago and at that time she received a steroid injection in the R hip. The next morning she noticed significant pain when her clothing brushed up against her hip. When she looked at the hip there was a new red rash with tiny white bumps. She says the area has been itchy and painful.  Ms. Cerone came to [facility] yesterday in order to housesit for her son. She has continued to have right hip pain, urinary frequency and had several episodes of vomiting last night so her son brought her to the ED today. Her son was present for the entire H&P and helped provide some of the history.  In the ED: T97.73F, RR20, HR85, BP204/103, SpO2 98%; UA, CT abd/pelvis and R hip 2 view xray completed; 1L NSS, 300mg  IV clindamycin, 4mg  morphine (x2), 4 mg Zofran (x3), 10 mEq K given  Physical Exam: Blood pressure 190/91, pulse 70, temperature 97.1 F (36.2 C), temperature source Oral, resp. rate 16, SpO2 94.00%.  General: resting in  bed in NAD, non-toxic appearing  HEENT: PERRL, EOMI, oropharynx w/o erythema or exudate  Cardiac: RRR, no rubs, murmurs or gallops  Pulm: clear to auscultation bilaterally,  moving normal volumes of air  Abd: soft, nondistended, BS present, lower abdominal/pelvic area mildly tender to palpation, no CVAT  GU: Small, erythematous papules in the pubic area; no suprapubic tenderness  Ext: warm and well perfused, no pedal edema, +2 DPs  MSK: Full ROM of R and L hip; right hip tender to palpation  Skin: Erythematous area with small papules on the skin of the R hip.  Neuro: alert and oriented X3, cranial nerves II-XII grossly intact, 5/5 MMS  Labs: Basic Metabolic Panel:   Recent Labs   06/28/13 0623   NA  139   K  3.4*   CL  102   GLUCOSE  127*   BUN  12   CREATININE  0.70    CBC:   Recent Labs   06/28/13 0614  06/28/13 0623   WBC  4.3  --   HGB  13.2  13.3   HCT  36.8  39.0   MCV  85.4  --   PLT  172  --     Urinalysis:   Recent Labs   06/28/13 0630   COLORURINE  YELLOW   LABSPEC  1.011   PHURINE  8.0   GLUCOSEU  NEGATIVE   HGBUR  SMALL*   BILIRUBINUR  NEGATIVE   KETONESUR  NEGATIVE   PROTEINUR  30*   UROBILINOGEN  0.2   NITRITE  NEGATIVE   LEUKOCYTESUR  NEGATIVE     HOSPITAL COURSE: #Nausea/vomiting - The patient had no diarrhea, leukocytosis or fever and her UTI had been adequately treated (negative UA on admission).  Her nausea and vomiting were likely related to viral illness (shingles).  We hydrated her with IV fluids, kept her NPO overnight and provided Zofran for nausea.  She felt better on hospital day two and was able to tolerate a regular diet.  She was discharged to home in good condition.  Hospital follow-up appointment will be scheduled with her PCP, Dr. Georjean Mode in [place], [place].  #Right hip pain - The patient had full ROM and xray was negative for fracture.  Pain preceding the appearance of a pruritic, vesicular rash over a single dermatome was  concerning for shingles. Also, the patient had never received Zoster Vaccine. We placed her on airborne/contact precautions and began treating with Valtrex.  Her pain was controlled with hydrocodone-acetaminophen.  She has been instructed to continue Valtrex 1g three times daily for 5 more days (7 day total treatment).  #CAD - The patient has had an MI (with 2 stents placed in 2012).  She had no chest pain, troponin was negative and EKG without evidence of ischemia.  We continued Coreg 6.25mg  BID and ASA 81mg .  #Hypertension - Her blood pressures were elevated (high of 204/1110mmHg) in the ED.  Pain was likely contributing.  We continued Coreg 6.25mg  BID and provided pain relief and pressures became stable.  #Osteopenia - evident on hip x-ray.  The patient has been advised to discuss DEXA scan with her PCP.  #Uterine fibroids - evident on CT.  The patient has been advised to discuss the need for GYN follow-up with her PCP.   DISCHARGE DATA: Vital Signs: BP 121/60  Pulse 73  Temp(Src) 98 F (36.7 C) (Oral)  Resp 18  Ht 5\' 6"  (1.676 m)  Wt 57.607 kg (127 lb)  BMI 20.51 kg/m2  SpO2 98%  Labs: Results for orders placed during the hospital encounter of 06/28/13 (from the past 24 hour(s))  COMPREHENSIVE METABOLIC PANEL  Status: Abnormal   Collection Time    06/29/13  6:29 AM      Result Value Range   Sodium 136  135 - 145 mEq/L   Potassium 3.9  3.5 - 5.1 mEq/L   Chloride 103  96 - 112 mEq/L   CO2 25  19 - 32 mEq/L   Glucose, Bld 80  70 - 99 mg/dL   BUN 15  6 - 23 mg/dL   Creatinine, Ser 4.78  0.50 - 1.10 mg/dL   Calcium 8.9  8.4 - 29.5 mg/dL   Total Protein 7.5  6.0 - 8.3 g/dL   Albumin 3.0 (*) 3.5 - 5.2 g/dL   AST 21  0 - 37 U/L   ALT 11  0 - 35 U/L   Alkaline Phosphatase 54  39 - 117 U/L   Total Bilirubin 0.4  0.3 - 1.2 mg/dL   GFR calc non Af Amer 80 (*) >90 mL/min   GFR calc Af Amer >90  >90 mL/min  CBC     Status: Abnormal   Collection Time    06/29/13  6:29 AM       Result Value Range   WBC 4.5  4.0 - 10.5 K/uL   RBC 3.79 (*) 3.87 - 5.11 MIL/uL   Hemoglobin 11.4 (*) 12.0 - 15.0 g/dL   HCT 62.1 (*) 30.8 - 65.7 %   MCV 86.3  78.0 - 100.0 fL   MCH 30.1  26.0 - 34.0 pg   MCHC 34.9  30.0 - 36.0 g/dL   RDW 84.6  96.2 - 95.2 %   Platelets 155  150 - 400 K/uL     Services Ordered on Discharge: Y = Yes; Blank = No PT:   OT:   RN:   Equipment:   Other:      Time Spent on Discharge: 35 min   Signed: Evelena Peat PGY 1, Internal Medicine Resident 06/29/2013, 2:34 PM

## 2013-06-29 NOTE — Progress Notes (Signed)
Subjective: [name] was seen and examined by me this morning.  Her R hip pain has improved to 4/10 with pain medication.  She has had no vomiting since yesterday.  She tells me she thinks the rash is spreading and notes redness of the suprapubic area and inguinal region.  Objective: Vital signs in last 24 hours: Filed Vitals:   06/28/13 1900 06/28/13 2106 06/29/13 0527 06/29/13 0902  BP:  124/72 133/63 139/75  Pulse:  62 68 72  Temp:  99.1 F (37.3 C) 97.6 F (36.4 C) 97.5 F (36.4 C)  TempSrc:  Oral Oral Oral  Resp:   18 19  Height: 5\' 6"  (1.676 m)     Weight:      SpO2:  97% 97% 98%   Weight change:   Intake/Output Summary (Last 24 hours) at 06/29/13 1024 Last data filed at 06/29/13 4782  Gross per 24 hour  Intake 1311.25 ml  Output   1700 ml  Net -388.75 ml   General: resting in bed in NAD HEENT: mucous membranes moist Cardiac: RRR, no rubs, murmurs or gallops Pulm: clear to auscultation bilaterally, moving normal volumes of air Abd: soft, nontender, nondistended, BS present Skin:  Vesicles on erythematous suprapubic skin and skin covering R hip Ext: warm and well perfused, no pedal edema Neuro: alert and oriented X3, following commands, moving extremities independently  Lab Results: Basic Metabolic Panel:  Recent Labs Lab 06/28/13 0623 06/29/13 0629  NA 139 136  K 3.4* 3.9  CL 102 103  CO2  --  25  GLUCOSE 127* 80  BUN 12 15  CREATININE 0.70 0.77  CALCIUM  --  8.9   Liver Function Tests:  Recent Labs Lab 06/29/13 0629  AST 21  ALT 11  ALKPHOS 54  BILITOT 0.4  PROT 7.5  ALBUMIN 3.0*    Recent Labs Lab 06/28/13 1335  LIPASE 32   CBC:  Recent Labs Lab 06/28/13 0614 06/28/13 0623 06/29/13 0629  WBC 4.3  --  4.5  HGB 13.2 13.3 11.4*  HCT 36.8 39.0 32.7*  MCV 85.4  --  86.3  PLT 172  --  155   Cardiac Enzymes:  Recent Labs Lab 06/28/13 1335  TROPONINI <0.30   BNP:  Recent Labs Lab 06/28/13 1335  PROBNP 21.3    Urinalysis:  Recent Labs Lab 06/28/13 0630  COLORURINE YELLOW  LABSPEC 1.011  PHURINE 8.0  GLUCOSEU NEGATIVE  HGBUR SMALL*  BILIRUBINUR NEGATIVE  KETONESUR NEGATIVE  PROTEINUR 30*  UROBILINOGEN 0.2  NITRITE NEGATIVE  LEUKOCYTESUR NEGATIVE   Micro Results: No results found for this or any previous visit (from the past 240 hour(s)). Studies/Results: Dg Hip Complete Right  06/28/2013   *RADIOLOGY REPORT*  Clinical Data: Nausea, right hip pain  RIGHT HIP - COMPLETE 2+ VIEW  Comparison: CT scan same day  Findings: Three views of the right hip submitted.  No acute fracture or subluxation.  Spurring of the greater femoral trochanter.  Joint space is  preserved.  There is diffuse osteopenia.  Distended urinary bladder is noted with contrast material from recent CT scan.  IMPRESSION: Diffuse osteopenia.  Mild degenerative changes.  No acute fracture or subluxation.  Distended urinary bladder.   Original Report Authenticated By: Natasha Mead, M.D.   Ct Abdomen Pelvis W Contrast  06/28/2013   *RADIOLOGY REPORT*  Clinical Data: Nausea, vomiting, urinary frequency, history of lung and breast cancer, kidney infection  CT ABDOMEN AND PELVIS WITH CONTRAST  Technique:  Multidetector CT  imaging of the abdomen and pelvis was performed following the standard protocol during bolus administration of intravenous contrast.  Contrast: 80mL OMNIPAQUE IOHEXOL 300 MG/ML  SOLN  Comparison: CT of the chest 11/27/2012 and CT scan/PET scan 01/30/2012  Findings: Sagittal images of the spine shows no destructive bony lesions.  Significant disc space flattening with endplate sclerotic changes mild anterior and mild posterior spurring noted at L5 S1 level.  There is a small amount of oral  contrast material in distal esophagus probable from gastroesophageal reflux disease. Visualized lung bases are unremarkable.  Enhanced liver shows no biliary ductal dilatation.  No focal hepatic mass.  Atherosclerotic calcifications of the  abdominal aorta and the iliac arteries are noted.  No aortic aneurysm.  The pancreas, spleen and left adrenal gland are unremarkable. Again noted heterogeneous right adrenal gland  nodular lesion. This measures 3.7 x 3.1 cm, stable in size from prior exam.  This lesion was biopsied in the 12/03/2012.  Please correlate with biopsy results.  Kidneys are symmetrical in size and enhancement. No hydronephrosis or hydroureter.  Again noted slight malrotated right kidney.  A tiny cyst is noted in mid pole posterior aspect of the left kidney measures 5 mm.  There is probable cortical scarring lower pole of the right kidney medially.  A tiny cyst in mid pole of the right kidney anteriorly measures 4 mm.  There is a tiny cyst in the lower pole of the right kidney laterally measures 3 mm.  Delayed renal images shows bilateral renal symmetrical excretion. Visualized proximal ureter is unremarkable bilaterally.  Oral contrast material was given to the patient.  There is no small bowel obstruction.  Left colon diverticula are noted.  No evidence of acute diverticulitis. No ascites or free air.  Scattered diverticula are noted transverse colon.  No acute diverticulitis.  Significant stool noted within cecum.  No pericecal inflammation.  The terminal ileum is unremarkable.  There is a distended urinary bladder.  No bladder filling defects are noted.  Again noted nodular contour of the uterus.  The uterus measures 7.6 by 4.9 cm.  A calcified fibroid in the right fundus measures 7.2 mm.  There is a second calcified fibroid right uterine body measures about 1 cm.  A heterogeneous nodular fibroid in the left uterine fundus measures 4.3 cm.  This is unchanged in size from 01/30/2012.  Correlation with GYN exam is recommended as clinically warranted.  Multiple sigmoid colon diverticula are noted.  No evidence of acute diverticulitis.  Moderate distention of the rectum with gas up to 9 cm.  No destructive bony lesions are noted within  pelvis.  Mild degenerative changes bilateral SI joints.  Coronal images shows mild degenerative changes bilateral hip joints.  IMPRESSION:  1.  Small amount of contrast material noted in the distal esophagus probable from gastroesophageal reflux 2.  Again noted heterogeneous nodular lesion right adrenal gland. This is stable in size from prior exam.  Please correlate with biopsy results from 12/03/2012 3.  No acute inflammatory process within abdomen or pelvis. 4.  No hydronephrosis or hydroureter.  Small bilateral renal cysts 5.  Colonic diverticula are noted.  No evidence of acute diverticulitis. 6.  Again noted mild enlarged uterus with nodular appearance.  A dominant left fundal fibroid measures 4.3 cm stable in size from prior exam.  Additional smaller calcified fibroids are noted within uterus.  Correlation with GYN exam is recommended as clinically warranted. 7.  Degenerative changes lumbar spine at L5 S1 level. 8.  Moderate distended urinary bladder. Moderate gaseous distention of the rectum .   Original Report Authenticated By: Natasha Mead, M.D.   Medications: I have reviewed the patient's current medications. Scheduled Meds: . aspirin EC  81 mg Oral Daily  . carvedilol  6.25 mg Oral BID WC  . heparin  5,000 Units Subcutaneous Q8H  . pantoprazole (PROTONIX) IV  40 mg Intravenous Q24H  . simvastatin  40 mg Oral QHS  . sodium chloride  3 mL Intravenous Q12H  . sodium chloride  3 mL Intravenous Q12H  . valACYclovir  500 mg Oral TID   Continuous Infusions: . sodium chloride 75 mL/hr at 06/28/13 1431   PRN Meds:.sodium chloride, HYDROcodone-acetaminophen, nitroGLYCERIN, ondansetron, sodium chloride  Assessment/Plan: 79 year old female with a PMH of CAD (s/p MI with 2 stent placement in 2012), HTN, HLD, breast CA and carcinoid lung CA (s/p lobectomy) and COPD presenting with a nine day history of N/V and right hip pain.   #Right hip pain - OA vs.Trochanteric bursitis vs. Shingles. The patient  has full ROM and xray of hip negative for fracture. No anterior hip or groin pain. Pain preceding the appearance of a pruritic rash is concerning for shingles. The patient has never received Zoster Vaccine.   - continue Valtrex 500mg  TID at discharge - hydrocodone-acetaminophen for pain   #Nausea/vomiting - UTI vs. Pyelonephritis are high on the differential given the patient's symptoms of frequency and dysuria and recent treatment for UTI. However, UA is negative for infection in the ED so this infection has resolved. Pyelonephritis is unlikely since the patient is afebrile, and has no leukocytosis or CVAT. Gastroenteritis is less likely as the patient has had these symptoms for over one week and has not had any diarrhea. Gastric outlet obstruction (patient has PUD) is also on the differential but patient lacks epigastric pain.  Patient has not had vomiting since yesterday.   - NSS 75cc/hr  - start heart healthy diet and see if she tolerates - Zofran prn  #CAD - s/p MI (with 2 stents placed 2012); patient denies chest pain, troponin negative, EKG without evidence of ischemia  - will continue Coreg 6.25mg  BID, ASA 81mg    #Hypertension - pressures elevated (high of 204/160mmHg) in ED  - continue Coreg 6.25mg  BID  #HLD - continue simvastatin 40mg  qHS   #PUD - Protonix   #DVT ppx - SQ heparin   Dispo: The patient is feeling better and can be discharged to home today with Valtrex and pain medication.  The patient does have a current PCP Tonny Bollman, MD) and does need an [facility] hospital follow-up appointment after discharge.  The patient does not have transportation limitations that hinder transportation to clinic appointments.  .Services Needed at time of discharge: Y = Yes, Blank = No PT:   OT:   RN:   Equipment:   Other:     LOS: 1 day   Evelena Peat, DO 06/29/2013, 10:24 AM

## 2013-06-29 NOTE — ED Provider Notes (Signed)
Medical screening examination/treatment/procedure(s) were conducted as a shared visit with non-physician practitioner(s) and myself.  I personally evaluated the patient during the encounter  N/V recent UTI, RLQ ABD pain and R hip pain no trauma. On exam has some mild RLQ TTP no acute ABD, pelvis stable, mild tenderness over hip without LE deformity. Labs and imaging obtained, medications provided.   Plan Admit ABD pain, N/V  Sunnie Nielsen, MD 06/29/13 [phone]

## 2013-06-30 LAB — URINE CULTURE

## 2013-07-02 ENCOUNTER — Telehealth: Payer: Self-pay | Admitting: Internal Medicine

## 2013-07-02 NOTE — Telephone Encounter (Signed)
I left a message on [name]'s personal voicemail that I have scheduled an appointment for her with Dr. Charlynn Grimes on 07/04/2013 at 12:15PM.

## 2013-07-03 ENCOUNTER — Ambulatory Visit: Payer: Medicare Other | Admitting: Surgery

## 2013-07-03 ENCOUNTER — Other Ambulatory Visit: Payer: Medicare Other

## 2013-07-09 ENCOUNTER — Ambulatory Visit: Payer: Medicare Other | Admitting: Cardiovascular Disease

## 2013-08-05 ENCOUNTER — Ambulatory Visit (INDEPENDENT_AMBULATORY_CARE_PROVIDER_SITE_OTHER): Payer: Medicare Other | Admitting: Cardiovascular Disease

## 2013-08-05 ENCOUNTER — Encounter: Payer: Self-pay | Admitting: Cardiovascular Disease

## 2013-08-05 VITALS — BP 160/66 | HR 76 | Ht 66.0 in | Wt 130.0 lb

## 2013-08-05 DIAGNOSIS — I2510 Atherosclerotic heart disease of native coronary artery without angina pectoris: Secondary | ICD-10-CM

## 2013-08-05 NOTE — Progress Notes (Signed)
HPI:  79 year old woman presenting for followup evaluation. The patient has coronary artery disease and presented with an inferoposterior MI in July 2012. She underwent PCI to right coronary artery and then underwent staged PCI the left circumflex. Her LAD disease was treated medically. A followup nuclear stress scan showed no significant ischemia and her left ventricular ejection fraction was normal at 67%. The patient was subsequently diagnosed with lung cancer. She underwent right middle and lower lobectomy with mediastinal lymph node resection.  The patient is getting over shingles. She was evaluated in the emergency department August 29 for this. Shingles involved her right hip and lower abdomen. She is still taking Valtrex and her symptoms have almost resolved. She's had no substernal chest pain or pressure. She has mild shortness of breath with activity, nonprogressive over the last year. She denies edema, palpitations, orthopnea, or PND.  Outpatient Encounter Prescriptions as of 08/05/2013  Medication Sig Dispense Refill  . aspirin EC 81 MG tablet Take 81 mg by mouth daily.      . carvedilol (COREG) 6.25 MG tablet Take 6.25 mg by mouth 2 (two) times daily with a meal.      . HYDROcodone-acetaminophen (NORCO) 7.5-325 MG per tablet Take 1 tablet by mouth every 6 (six) hours as needed for pain.  30 tablet  0  . nitroGLYCERIN (NITROSTAT) 0.4 MG SL tablet Place 0.4 mg under the tongue every 5 (five) minutes as needed. For chest pains up to 3 doses. If pain still persist call emergency personal.       . simvastatin (ZOCOR) 40 MG tablet Take 1 tablet (40 mg total) by mouth at bedtime.  90 tablet  3  . valACYclovir (VALTREX) 1000 MG tablet Take 1 tablet (1,000 mg total) by mouth 3 (three) times daily.  16 tablet  0   No facility-administered encounter medications on file as of 08/05/2013.    Allergies  Allergen Reactions  . Penicillins Hives  . Oxycodone     Agitation     Past Medical  History  Diagnosis Date  . Hypertension   . Dyslipidemia   . CAD (coronary artery disease)     a. 05/2011 [facility] MI;  b. 05/2011 Cath/PCI RCA - DES;  c. 05/2011 Staged PCI LCX, residual mod LAD dzs;  d. 2012 nonischemic Myoveiw, EF 67%.  . Myocardial infarction     05/2011 DR Kathyleen Radice [facility] )  . Heart murmur   . Shortness of breath   . Headache(784.0)   . Breast cancer   . Cancer of eye   . Lung cancer     a. 2013 bronchoscopy - right middle lobe mass + for carcinoid  . Arthritis   . Depression    ROS: Negative except as per HPI  BP 160/66  Pulse 76  Ht 5\' 6"  (1.676 m)  Wt 130 lb (58.968 kg)  BMI 20.99 kg/m2  PHYSICAL EXAM: Pt is alert and oriented, NAD HEENT: normal Neck: JVP - normal, carotids 2+= without bruits Lungs: CTA bilaterally CV: RRR without murmur or gallop Abd: soft, NT, Positive BS, no hepatomegaly Ext: no C/C/E, distal pulses intact and equal Skin: warm/dry no rash  ASSESSMENT AND PLAN: 1. Coronary artery disease, native vessel. The patient is stable without anginal symptoms. She remains on antiplatelet therapy with aspirin alone.  2. Hyperlipidemia. Lipids were last checked one year ago with a total cholesterol of 136, triglycerides 89, LDL 102, and HDL 16. She continues on simvastatin 40 mg daily. Her lipids  are followed by her primary care physician in [place].  3. Hypertension. Blood pressure is elevated today. Reports a history of white coat hypertension. Her home blood pressures have ranged in the 120 to 130s. No changes were made today.  For followup I will see her back in one year.  Tonny Bollman 08/05/2013 12:47 PM

## 2013-08-05 NOTE — Patient Instructions (Signed)
Your physician wants you to follow-up in: 1 year  You will receive a reminder letter in the mail two months in advance. If you don't receive a letter, please call our office to schedule the follow-up appointment.  Your physician recommends that you continue on your current medications as directed. Please refer to the Current Medication list given to you today.  

## 2013-08-06 ENCOUNTER — Other Ambulatory Visit: Payer: Self-pay | Admitting: Cardiovascular Disease

## 2013-08-10 LAB — CREATININE, SERUM: Creat: 0.71 mg/dL (ref 0.50–1.10)

## 2013-08-10 LAB — BUN: BUN: 15 mg/dL (ref 6–23)

## 2013-08-14 ENCOUNTER — Encounter: Payer: Self-pay | Admitting: Surgery

## 2013-08-14 ENCOUNTER — Ambulatory Visit (INDEPENDENT_AMBULATORY_CARE_PROVIDER_SITE_OTHER): Payer: Medicare Other | Admitting: Surgery

## 2013-08-14 ENCOUNTER — Other Ambulatory Visit: Payer: Self-pay | Admitting: *Deleted

## 2013-08-14 ENCOUNTER — Ambulatory Visit
Admission: RE | Admit: 2013-08-14 | Discharge: 2013-08-14 | Disposition: A | Discharge status: Home / Self Care | Payer: Medicare Other | Source: Ambulatory Visit | Attending: Surgery | Admitting: Surgery

## 2013-08-14 VITALS — BP 143/78 | HR 75 | Resp 16 | Ht 66.0 in | Wt 129.0 lb

## 2013-08-14 DIAGNOSIS — C3430 Malignant neoplasm of lower lobe, unspecified bronchus or lung: Secondary | ICD-10-CM

## 2013-08-14 DIAGNOSIS — C3490 Malignant neoplasm of unspecified part of unspecified bronchus or lung: Secondary | ICD-10-CM

## 2013-08-14 DIAGNOSIS — Q891 Congenital malformations of adrenal gland: Secondary | ICD-10-CM

## 2013-08-14 DIAGNOSIS — Z09 Encounter for follow-up examination after completed treatment for conditions other than malignant neoplasm: Secondary | ICD-10-CM

## 2013-08-14 DIAGNOSIS — C342 Malignant neoplasm of middle lobe, bronchus or lung: Secondary | ICD-10-CM

## 2013-08-14 DIAGNOSIS — D3500 Benign neoplasm of unspecified adrenal gland: Secondary | ICD-10-CM

## 2013-08-14 DIAGNOSIS — D3501 Benign neoplasm of right adrenal gland: Secondary | ICD-10-CM

## 2013-08-14 MED ORDER — IOHEXOL 300 MG/ML  SOLN
100.0000 mL | Freq: Once | INTRAMUSCULAR | Status: AC | PRN
  Administered 2013-08-14: 100 mL via INTRAVENOUS

## 2013-08-14 NOTE — Progress Notes (Signed)
[address]       [name] [address]             [phone]        HPI:  The patient returns today for followup status post right thoracotomy with right middle and lower lobectomy on 03/14/2012 for a T2A, N0 well-differentiated neuroendocrine tumor. She's doing well. She complains of mild dyspnea when walking any significant distance. She has had no chest pain. She denies any cough, sputum production, or hemoptysis. Her weight has been stable. Appetite is good. She denies any headache or visual changes. She saw Dr. Excell Seltzer recently and is doing well from a cardiac standpoint.   Current Outpatient Prescriptions  Medication Sig Dispense Refill  . aspirin EC 81 MG tablet Take 81 mg by mouth daily.      . carvedilol (COREG) 6.25 MG tablet Take 6.25 mg by mouth 2 (two) times daily with a meal.      . nitroGLYCERIN (NITROSTAT) 0.4 MG SL tablet Place 0.4 mg under the tongue every 5 (five) minutes as needed. For chest pains up to 3 doses. If pain still persist call emergency personal.       . simvastatin (ZOCOR) 40 MG tablet TAKE 1 TABLET (40 MG TOTAL) BY MOUTH AT BEDTIME.  90 tablet  3   No current facility-administered medications for this visit.     Physical Exam: BP 143/78  Pulse 75  Resp 16  Ht 5\' 6"  (1.676 m)  Wt 129 lb (58.514 kg)  BMI 20.83 kg/m2  She looks well.  Lung exam is clear.  The right thoracotomy scar is well-healed. There are no skin lesions.  There is no cervical or supraclavicular adenopathy.  Abdominal exam shows active bowel sounds. Abdomen is soft and nontender with no palpable masses organomegaly.  Diagnostic Tests:  CLINICAL DATA: Lung cancer with right adrenal enlargement.  Constipation. History of bilateral breast cancer.  EXAM:  CT CHEST AND ABDOMEN WITH CONTRAST  TECHNIQUE:  Multidetector CT imaging of the chest and abdomen was performed  following the standard protocol during bolus administration of  intravenous contrast.   CONTRAST: OMNIPAQUE IOHEXOL 300 MG/ML SOLN  COMPARISON: CT abdomen 06/28/2013 and CT chest 11/27/2012. CT  biopsy 12/03/2012. CT chest 01/17/2012.  FINDINGS:  CT CHEST FINDINGS  No pathologically enlarged mediastinal, hilar or axillary lymph  nodes. Surgical clips are seen in both axillary regions. Coronary  artery calcification. Heart is mildly enlarged. No pericardial  effusion.  Tiny amount of pleural fluid is seen at the posterior medial aspect  of the inferior right hemi thorax, slightly decreased. Postoperative  changes are seen in the right hemi thorax with associated volume  loss. Minimal dependent atelectasis in the left lower lobe. Lungs  are otherwise clear. Airway is otherwise unremarkable.  CT ABDOMEN FINDINGS  Liver and gallbladder are unremarkable. A heterogeneous mass in the  right adrenal gland measures 3.1 x 3.9 cm, stable from 11/27/2012  but slightly increased in size from baseline examination of  01/17/2012, at which time it measured 2.1 x 3.7 cm. On portal venous  phase imaging, it measures 52 Hounsfield units and on delayed  nephrographic phase imaging, it measures 60 Hounsfield units. Left  adrenal gland is unremarkable.  Right kidney is non rotated and somewhat ptotic. Sub cm low  attenuation lesion in the left kidney is too small to characterize.  Statistically, a cyst is likely. Possible left renal sinus cysts.  Spleen, pancreas, stomach  and visualized bowel are unremarkable.  Note is made that a fair amount of stool is seen in the colon,  suggestive of constipation. Atherosclerotic calcification of the  arterial vasculature without abdominal aortic aneurysm. No  pathologically enlarged lymph nodes. No free fluid. No worrisome  lytic or sclerotic lesions.  IMPRESSION:  CT CHEST IMPRESSION  1. Postoperative changes in the right hemi thorax without evidence  of recurrent disease.  2. Tiny amount of right pleural fluid, decreased from prior.  CT  ABDOMEN IMPRESSION  Right adrenal mass, stable from 11/27/2012 but slightly increased in  size from 01/17/2012. Lesion cannot be characterized as an adenoma  by today's imaging characteristics. This lesion was reportedly  biopsied on 12/03/2012. If further evaluation is desired, MR abdomen  without and with contrast is recommended.  Electronically Signed  By: Leanna Battles M.D.  On: 08/14/2013 10:50    Impression:  She continues to do well following right middle and lower lobectomy for carcinoid tumor. She has a 3.1 x 3.9 cm heterogeneous mass in the right adrenal gland that is unchanged from her CT scan on 11/27/2012 but slightly increased in size from her baseline examination of 01/17/2012 when it was 2.1 x 3.7 cm. She had a CT guided needle biopsy of this on 12/03/2012 which showed benign adrenal gland tissue. The adrenal gland was not hypermetabolic on PET scan that was done on 01/30/2012. I recommended that we continue to follow her with a repeat CT scan of the chest and abdomen in about 6 months.  Plan:  She will return in 6 months with a CT scan the chest and abdomen.

## 2013-08-16 NOTE — Progress Notes (Signed)
Erroneous encounter

## 2014-01-16 ENCOUNTER — Other Ambulatory Visit: Payer: Self-pay

## 2014-01-16 DIAGNOSIS — D381 Neoplasm of uncertain behavior of trachea, bronchus and lung: Secondary | ICD-10-CM

## 2014-01-16 DIAGNOSIS — E278 Other specified disorders of adrenal gland: Secondary | ICD-10-CM

## 2014-01-16 DIAGNOSIS — R1900 Intra-abdominal and pelvic swelling, mass and lump, unspecified site: Secondary | ICD-10-CM

## 2014-02-07 ENCOUNTER — Other Ambulatory Visit: Payer: Self-pay | Admitting: Surgery

## 2014-02-08 LAB — BUN: BUN: 13 mg/dL (ref 6–23)

## 2014-02-08 LAB — CREATININE, SERUM: CREATININE: 0.64 mg/dL (ref 0.50–1.10)

## 2014-02-12 ENCOUNTER — Ambulatory Visit (INDEPENDENT_AMBULATORY_CARE_PROVIDER_SITE_OTHER): Payer: Medicare Other | Admitting: Surgery

## 2014-02-12 ENCOUNTER — Encounter: Payer: Self-pay | Admitting: Surgery

## 2014-02-12 ENCOUNTER — Ambulatory Visit
Admission: RE | Admit: 2014-02-12 | Discharge: 2014-02-12 | Disposition: A | Discharge status: Home / Self Care | Payer: Medicare Other | Source: Ambulatory Visit | Attending: Surgery | Admitting: Surgery

## 2014-02-12 VITALS — BP 159/82 | HR 64 | Resp 18 | Ht 66.0 in | Wt 129.0 lb

## 2014-02-12 DIAGNOSIS — Z902 Acquired absence of lung [part of]: Secondary | ICD-10-CM

## 2014-02-12 DIAGNOSIS — D381 Neoplasm of uncertain behavior of trachea, bronchus and lung: Secondary | ICD-10-CM

## 2014-02-12 DIAGNOSIS — E278 Other specified disorders of adrenal gland: Secondary | ICD-10-CM

## 2014-02-12 DIAGNOSIS — D3A00 Benign carcinoid tumor of unspecified site: Secondary | ICD-10-CM

## 2014-02-12 DIAGNOSIS — Z9889 Other specified postprocedural states: Secondary | ICD-10-CM

## 2014-02-12 DIAGNOSIS — R1900 Intra-abdominal and pelvic swelling, mass and lump, unspecified site: Secondary | ICD-10-CM

## 2014-02-12 DIAGNOSIS — E279 Disorder of adrenal gland, unspecified: Secondary | ICD-10-CM

## 2014-02-12 MED ORDER — IOHEXOL 300 MG/ML  SOLN
100.0000 mL | Freq: Once | INTRAMUSCULAR | Status: AC | PRN
  Administered 2014-02-12: 100 mL via INTRAVENOUS

## 2014-02-12 NOTE — Progress Notes (Signed)
HPI:  The patient returns today for followup status post right thoracotomy with right middle and lower lobectomy on 03/14/2012 for a T2A, N0 well-differentiated neuroendocrine tumor. She's doing well. She complains of mild dyspnea when walking any significant distance. She has had no chest pain. She denies any cough, sputum production, or hemoptysis. Her weight has been stable. Appetite is good. She denies any headache or visual changes.     Current Outpatient Prescriptions  Medication Sig Dispense Refill  . aspirin EC 81 MG tablet Take 81 mg by mouth daily.      . carvedilol (COREG) 6.25 MG tablet Take 6.25 mg by mouth 2 (two) times daily with a meal.      . nitroGLYCERIN (NITROSTAT) 0.4 MG SL tablet Place 0.4 mg under the tongue every 5 (five) minutes as needed. For chest pains up to 3 doses. If pain still persist call emergency personal.       . simvastatin (ZOCOR) 40 MG tablet TAKE 1 TABLET (40 MG TOTAL) BY MOUTH AT BEDTIME.  90 tablet  3   No current facility-administered medications for this visit.     Physical Exam: BP 159/82  Pulse 64  Resp 18  Ht 5\' 6"  (1.676 m)  Wt 129 lb (58.514 kg)  BMI 20.83 kg/m2  SpO2 98% She looks well.  Lung exam is clear.  The right thoracotomy scar is well-healed. There are no skin lesions.  There is no cervical or supraclavicular adenopathy.  Abdominal exam shows active bowel sounds. Abdomen is soft and nontender with no palpable masses organomegaly.   Diagnostic Tests:  Areta Haber, MD Wed Feb 12, 2014 11:09:31 AM EDT       ADDENDUM REPORT: 02/12/2014 11:07  ADDENDUM:  The patient was returned for late portal venous phase imaging. This  is on the series 4 timed at approximately 1052 hours. This  demonstrates heterogeneous enhancement of the anterior portion of  the lesion, with Hounsfield unit measurements varying between 81 and  93 HU. This area measures between 49 and 64 HU on washout images.  Findings are overall  nondiagnostic. Given the slow interval growth,  and the enhancement characteristics, adenoma is slightly favored.  However underlying collision tumor or a component of remote  hemorrhage into an underlying adenoma are considerations. Potential  clinical strategies include attention on follow-up (with dedicated  precontrast, portal venous phase, and 10-15 minute delayed images of  the abdomen) and/or further characterization with repeat PET.  Electronically Signed  By: Abigail Miyamoto M.D.  On: 02/12/2014 11:07       Study Result    CLINICAL DATA: Right-sided carcinoid tumor and right adrenal mass.  Bilateral breast cancer. Bilateral mastectomies. Ex-smoker for 25  years. Cough. Shortness of breath with exertion. Constipation. Right  adrenal enlargement with negative biopsy.  EXAM:  CT CHEST WITH CONTRAST  CT ABDOMEN WITHOUT AND WITH CONTRAST  TECHNIQUE:  Multidetector CT imaging of the abdomen was performed without  intravenous contrast. Multidetector CT imaging of the chest and  abdomen was then performed during bolus administration of  intravenous contrast.  CONTRAST: 129mL OMNIPAQUE IOHEXOL 300 MG/ML SOLN  COMPARISON: 08/15/2013  FINDINGS:  CT CHEST FINDINGS  Lungs/Pleura: Right-sided surgical changes with resultant volume  loss. No nodules or airspace opacities.  Small volume right-sided pleural fluid is similar.  Heart/Mediastinum: Bilateral mastectomy and axillary node  dissection. No axillary adenopathy. No supraclavicular adenopathy.  Tortuous thoracic aorta with atherosclerosis throughout. Mild  cardiomegaly with multivessel coronary  artery atherosclerosis. No  pericardial effusion. No central pulmonary embolism, on this  non-dedicated study. A low right paratracheal node measures 9 mm on  image 28 and appears slightly enlarged from 5 mm on the prior exam.  Not pathologic by size criteria. No hilar adenopathy.  CT ABDOMEN FINDINGS  Abdomen/: Normal liver, spleen,  stomach. Tiny cystic lesion in the  pancreatic body is suspected at 6 mm on image 63/ series 5. Likely  similar on the prior exam.  Normal gallbladder, biliary tract, left adrenal gland.  A right adrenal mass measures 4.1 x 3.0 cm on image 62/series 5. 3.9  by 3.1 cm on the prior exam. When compared back to 01/30/2012,  enlarged from 2.0 by with 3.5 cm. Measures approximately 39 HU prior  to contrast. 48 HU on late arterial/ early portal venous phase  imaging. 44 HU on 10 minutes delays. Standard/late portal venous  phase images not performed.  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 Too small to characterize lesions in the left kidney. Left  renal sinus cysts.  Abdominal aortic and branch vessel atherosclerosis. No  retroperitoneal or retrocrural adenopathy. Scattered colonic  diverticula. Normal imaged portions of the terminal ileum and  appendix. Normal abdominal small bowel without ascites. No evidence  of omental or peritoneal disease.  Incompletely imaged soft tissue density at the pelvic brim likely  represents the superior most aspect of an enlarged fibroid uterus,  when compared to 01/30/12 PET.  Bones/Musculoskeletal: Degenerative disc disease at the lumbosacral  junction.  IMPRESSION:  CT CHEST IMPRESSION  1. Right-sided surgical changes, without definite evidence of  locally recurrent or metastatic disease within the chest.  2. Similar small right pleural effusion.  3. A right paratracheal node measures 9 mm, not pathologic by size  criteria. This has enlarged since the prior exam, and warrants  followup attention.  CT ABDOMEN IMPRESSION  1. Right adrenal mass which is similar in size to 08/14/2013, but  has enlarged since 01/30/2012. Its post-contrast washout  characteristics are indeterminate based on the current exam.  However, standard portal venous phase imaging was performed. The  patient will be returned for late portal venous  phase imaging of the  adrenal. This will hopefully allow definitive washout  characterization analysis. Addendum will be created.  2. Otherwise, no evidence of metastatic disease within the abdomen.  3. Suspicion of a 6 mm cystic lesion in the pancreatic body. Most  likely a small pseudocyst. This could be re-evaluated at followup.  Electronically Signed:  By: Abigail Miyamoto M.D.  On: 02/12/2014 10:21      Impression:  She continues to do well following right middle and lower lobectomy for carcinoid tumor. There is no evidence of recurrent lung cancer. The right adrenal mass is unchanged compared to her scan in October 2014 but slightly larger compared to the scan in April 2013. The significance of this is unclear but she had a CT guided needle biopsy of this on 12/03/2012 which showed benign adrenal gland tissue. The adrenal gland was not hypermetabolic on PET scan that was done on 01/30/2012. I recommended that we continue to follow her with a repeat CT scan of the chest and abdomen in about 6 months.    Plan:  She will return in 6 months with a CT scan the chest and abdomen.

## 2014-07-22 ENCOUNTER — Other Ambulatory Visit: Payer: Self-pay | Admitting: *Deleted

## 2014-07-22 DIAGNOSIS — R109 Unspecified abdominal pain: Secondary | ICD-10-CM

## 2014-07-22 DIAGNOSIS — R222 Localized swelling, mass and lump, trunk: Secondary | ICD-10-CM

## 2014-07-28 ENCOUNTER — Other Ambulatory Visit: Payer: Self-pay | Admitting: *Deleted

## 2014-07-28 DIAGNOSIS — R222 Localized swelling, mass and lump, trunk: Secondary | ICD-10-CM

## 2014-08-16 LAB — BUN: BUN: 18 mg/dL (ref 6–23)

## 2014-08-16 LAB — CREATININE, SERUM: Creat: 0.71 mg/dL (ref 0.50–1.10)

## 2014-08-20 ENCOUNTER — Ambulatory Visit
Admission: RE | Admit: 2014-08-20 | Discharge: 2014-08-20 | Disposition: A | Discharge status: Home / Self Care | Payer: Medicare Other | Source: Ambulatory Visit | Attending: Surgery | Admitting: Surgery

## 2014-08-20 ENCOUNTER — Encounter: Payer: Self-pay | Admitting: Cardiovascular Disease

## 2014-08-20 ENCOUNTER — Ambulatory Visit (INDEPENDENT_AMBULATORY_CARE_PROVIDER_SITE_OTHER): Payer: Medicare Other | Admitting: Surgery

## 2014-08-20 ENCOUNTER — Encounter: Payer: Self-pay | Admitting: Surgery

## 2014-08-20 ENCOUNTER — Other Ambulatory Visit: Payer: Self-pay | Admitting: Surgery

## 2014-08-20 ENCOUNTER — Ambulatory Visit (INDEPENDENT_AMBULATORY_CARE_PROVIDER_SITE_OTHER): Payer: Medicare Other | Admitting: Cardiovascular Disease

## 2014-08-20 VITALS — BP 144/82 | HR 64 | Ht 66.0 in | Wt 142.1 lb

## 2014-08-20 VITALS — BP 133/80 | HR 75 | Resp 20 | Ht 66.0 in | Wt 142.0 lb

## 2014-08-20 DIAGNOSIS — E279 Disorder of adrenal gland, unspecified: Secondary | ICD-10-CM

## 2014-08-20 DIAGNOSIS — Z902 Acquired absence of lung [part of]: Secondary | ICD-10-CM

## 2014-08-20 DIAGNOSIS — I2510 Atherosclerotic heart disease of native coronary artery without angina pectoris: Secondary | ICD-10-CM

## 2014-08-20 DIAGNOSIS — D3A00 Benign carcinoid tumor of unspecified site: Secondary | ICD-10-CM

## 2014-08-20 DIAGNOSIS — E278 Other specified disorders of adrenal gland: Secondary | ICD-10-CM

## 2014-08-20 DIAGNOSIS — Z9889 Other specified postprocedural states: Secondary | ICD-10-CM

## 2014-08-20 DIAGNOSIS — R222 Localized swelling, mass and lump, trunk: Secondary | ICD-10-CM

## 2014-08-20 DIAGNOSIS — E785 Hyperlipidemia, unspecified: Secondary | ICD-10-CM

## 2014-08-20 DIAGNOSIS — R109 Unspecified abdominal pain: Secondary | ICD-10-CM

## 2014-08-20 DIAGNOSIS — I10 Essential (primary) hypertension: Secondary | ICD-10-CM

## 2014-08-20 MED ORDER — IOHEXOL 300 MG/ML  SOLN
100.0000 mL | Freq: Once | INTRAMUSCULAR | Status: AC | PRN
  Administered 2014-08-20: 100 mL via INTRAVENOUS

## 2014-08-20 NOTE — Progress Notes (Signed)
    HPI:   80 year-old woman presenting for follow-up evaluation, last seen one year ago. She has CAD and initially presented with an inferoposterior MI in July 2012. She underwent PCI to right coronary artery and then underwent staged PCI the left circumflex. Her LAD disease was treated medically. A followup nuclear stress scan showed no significant ischemia and her left ventricular ejection fraction was normal at 67%. The patient was subsequently diagnosed with lung cancer. She underwent right middle and lower lobectomy with mediastinal lymph node resection and continues to follow with Dr Cyndia Bent.   She is doing well from a CV perspective and denies CP,  edema, orthopnea, or PND. Exertional dyspnea is mild and unchanged since last year. Also with generalized fatigue. She reports compliance with her medications. Lipids are followed by her PCP.    Outpatient Encounter Prescriptions as of 08/20/2014  Medication Sig  . aspirin EC 81 MG tablet Take 81 mg by mouth daily.  . carvedilol (COREG) 6.25 MG tablet Take 6.25 mg by mouth 2 (two) times daily with a meal.  . nitroGLYCERIN (NITROSTAT) 0.4 MG SL tablet Place 0.4 mg under the tongue every 5 (five) minutes as needed. For chest pains up to 3 doses. If pain still persist call emergency personal.   . simvastatin (ZOCOR) 40 MG tablet TAKE 1 TABLET (40 MG TOTAL) BY MOUTH AT BEDTIME.    Allergies  Allergen Reactions  . Penicillins Hives  . Oxycodone     Agitation     Past Medical History  Diagnosis Date  . Hypertension   . Dyslipidemia   . CAD (coronary artery disease)     a. 05/2011 [facility] MI;  b. 05/2011 Cath/PCI RCA - DES;  c. 05/2011 Staged PCI LCX, residual mod LAD dzs;  d. 2012 nonischemic Myoveiw, EF 67%.  . Myocardial infarction     05/2011 DR Holley Wirt [facility] )  . Heart murmur   . Shortness of breath   . Headache(784.0)   . Breast cancer   . Cancer of eye   . Lung cancer     a. 2013 bronchoscopy - right middle lobe mass + for  carcinoid  . Arthritis   . Depression     BP 144/82  Pulse 64  Ht 5\' 6"  (1.676 m)  Wt 142 lb 1.9 oz (64.465 kg)  BMI 22.95 kg/m2  PHYSICAL EXAM: Pt is alert and oriented, NAD HEENT: normal Neck: JVP - normal, carotids 2+= without bruits Lungs: CTA bilaterally CV: RRR without murmur or gallop Abd: soft, NT, Positive BS, no hepatomegaly Ext: no C/C/E, distal pulses intact and equal Skin: warm/dry no rash  EKG:  NSR 64 bpm, within normal limits.  ASSESSMENT AND PLAN: 1. CAD, native vessel without anginal symptoms. 2. Hyperlipidemia  Stable without angina. Continue ASA, statin, beta-blocker. Follow-up one year.  Sherren Mocha 08/20/2014 10:54 AM

## 2014-08-20 NOTE — Patient Instructions (Signed)
Your physician wants you to follow-up in: 1 YEAR with Dr Cooper.  You will receive a reminder letter in the mail two months in advance. If you don't receive a letter, please call our office to schedule the follow-up appointment.  Your physician recommends that you continue on your current medications as directed. Please refer to the Current Medication list given to you today.  

## 2014-08-21 ENCOUNTER — Encounter: Payer: Self-pay | Admitting: Cardiovascular Disease

## 2014-08-21 ENCOUNTER — Encounter: Payer: Self-pay | Admitting: Surgery

## 2014-08-21 DIAGNOSIS — E278 Other specified disorders of adrenal gland: Secondary | ICD-10-CM | POA: Insufficient documentation

## 2014-08-21 NOTE — Progress Notes (Signed)
HPI:  The patient returns today for followup status post right thoracotomy with right middle and lower lobectomy on 03/14/2012 for a T2A, N0 well-differentiated neuroendocrine tumor. She's doing well. She has had no chest pain. She denies any cough, sputum production, or hemoptysis. Her weight has been stable. Appetite is good. She denies any headache or visual changes. She has a  right adrenal mass that was unchanged on her last CT from 02/12/2014 compared to her scan in October 2014 but slightly larger compared to the scan in April 2013. The significance of this is unclear but she had a CT guided needle biopsy of this on 12/03/2012 which showed benign adrenal gland tissue. The adrenal gland was not hypermetabolic on PET scan that was done on 01/30/2012. I recommended continued follow up.   Current Outpatient Prescriptions  Medication Sig Dispense Refill  . aspirin EC 81 MG tablet Take 81 mg by mouth daily.      . carvedilol (COREG) 6.25 MG tablet Take 6.25 mg by mouth 2 (two) times daily with a meal.      . nitroGLYCERIN (NITROSTAT) 0.4 MG SL tablet Place 0.4 mg under the tongue every 5 (five) minutes as needed. For chest pains up to 3 doses. If pain still persist call emergency personal.       . simvastatin (ZOCOR) 40 MG tablet TAKE 1 TABLET (40 MG TOTAL) BY MOUTH AT BEDTIME.  90 tablet  3   No current facility-administered medications for this visit.     Physical Exam:  BP 133/80  Pulse 75  Resp 20  Ht 5\' 6"  (1.676 m)  Wt 142 lb (64.411 kg)  BMI 22.93 kg/m2  SpO2 98% She looks well.  Lung exam is clear.  The right thoracotomy scar is well-healed. There are no skin lesions.  There is no cervical or supraclavicular adenopathy.  Abdominal exam shows active bowel sounds. Abdomen is soft and nontender with no palpable masses organomegaly.      Diagnostic Tests:  CLINICAL DATA: History of lung cancer status post lung surgery in  2013. Patient also has a history bilateral  breast cancer. Follow-up  right adrenal gland mass.  EXAM:  CT CHEST WITH CONTRAST  CT ABDOMEN WITHOUT AND WITH CONTRAST  TECHNIQUE:  Multidetector CT imaging of the abdomen was performed without  intravenous contrast. Multidetector CT imaging of the chest and  abdomen was then performed during bolus administration of  intravenous contrast.  CONTRAST: 187mL OMNIPAQUE IOHEXOL 300 MG/ML SOLN  COMPARISON: 02/12/2014  FINDINGS:  CT CHEST FINDINGS  Chest wall: Stable surgical changes from bilateral mastectomies. No  chest wall mass, supraclavicular or axillary lymphadenopathy to  suggest recurrent disease. There are small scattered axillary lymph  nodes bilaterally which appears stable. The bony thorax is intact.  No destructive bone lesions or spinal canal compromise.  Mediastinum: The heart is normal in size. No pericardial effusion.  No mediastinal or hilar mass or lymphadenopathy. Small scattered  lymph nodes are stable. The esophagus is grossly normal. The aorta  demonstrates tortuosity, ectasia and calcification. No focal  aneurysm or dissection. Dense 3 vessel coronary artery  calcifications are noted along with coronary artery stents.  Lungs: Stable emphysematous changes. Stable surgical changes  involving the right lung. No findings suspicious for recurrent  disease or metastatic pulmonary nodules.  CT ABDOMEN FINDINGS  The liver demonstrates diffuse fatty infiltration but no focal  hepatic lesions. No biliary dilatation. The gallbladder is normal.  No common bile duct  dilatation. The pancreas is stable. Mild atrophy  is again noted. The spleen is normal in size. No focal lesions. The  left adrenal gland and both kidneys are unremarkable and stable.  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  The right adrenal gland lesion is relatively stable. It measures a  maximum of 4.1 x 3.0 cm on the prior study and now measures 4.1 x  3.2 cm. Triple phase imaging of the  lesion demonstrates a largely  nonenhancing. It measures 42 Hounsfield units precontrast, 44  Hounsfield units on the arterial phase sequence, 51 Hounsfield units  on the portal venous phase and 45 Hounsfield units on the delayed  phase. However, in the upper aspect of the lesion there are a few  enhancing elements. These could be enhancing septa or more likely  small vessels. It has enlarged slightly since 2013 (3.5 x 2.1 cm).  No abdominal lymphadenopathy. Stable tortuosity, ectasia and  calcification of the thoracic aorta. The stomach, duodenum,  visualized small bowel and visualized colon are grossly normal.  IMPRESSION:  Slowly enlarging right adrenal gland lesion but not significantly  changed since the study from April 2015. I think it is unlikely this  is a metastatic lesion. There appear to be vessels coursing through  lesion but I do not see any solid enhancing nodule. Recommend  continued observation with routine CT imaging. If a continues to  enlarge it may need a biopsy or surgical excision.  No findings for recurrent tumor or metastatic disease in the chest.  Electronically Signed  By: Kalman Jewels M.D.  On: 08/20/2014 13:03    Impression:  She has no sign of recurrent carcinoid tumor in the chest. The right adrenal lesion has slowly enlarged but is unchanged from 01/2014. I agree with radiology that this is unlikely to be a metastatic lesion especially considering her previous negative PET and biopsy findings.  Plan:  I will see her back in 1 year with a CT scan of the chest and abdomen.

## 2014-08-27 ENCOUNTER — Ambulatory Visit: Payer: Medicare Other | Admitting: Surgery

## 2015-08-07 ENCOUNTER — Other Ambulatory Visit: Payer: Self-pay | Admitting: *Deleted

## 2015-08-07 DIAGNOSIS — D3A00 Benign carcinoid tumor of unspecified site: Secondary | ICD-10-CM

## 2015-08-20 ENCOUNTER — Ambulatory Visit: Payer: Medicare Other | Admitting: Physician Assistant

## 2015-08-24 ENCOUNTER — Other Ambulatory Visit: Payer: Self-pay | Admitting: Surgery

## 2015-08-24 LAB — CREATININE, ISTAT: Creatinine, IStat: 0.8 mg/dL (ref 0.6–1.3)

## 2015-08-26 ENCOUNTER — Other Ambulatory Visit: Payer: Self-pay | Admitting: Surgery

## 2015-08-26 ENCOUNTER — Ambulatory Visit
Admission: RE | Admit: 2015-08-26 | Discharge: 2015-08-26 | Disposition: A | Discharge status: Home / Self Care | Payer: Medicare Other | Source: Ambulatory Visit | Attending: Surgery | Admitting: Surgery

## 2015-08-26 ENCOUNTER — Ambulatory Visit (INDEPENDENT_AMBULATORY_CARE_PROVIDER_SITE_OTHER): Payer: Medicare Other | Admitting: Cardiovascular Disease

## 2015-08-26 ENCOUNTER — Ambulatory Visit (INDEPENDENT_AMBULATORY_CARE_PROVIDER_SITE_OTHER): Payer: Medicare Other | Admitting: Surgery

## 2015-08-26 ENCOUNTER — Encounter: Payer: Self-pay | Admitting: Cardiovascular Disease

## 2015-08-26 ENCOUNTER — Encounter: Payer: Self-pay | Admitting: Surgery

## 2015-08-26 VITALS — BP 148/82 | HR 74 | Ht 66.0 in | Wt 148.0 lb

## 2015-08-26 VITALS — BP 180/80 | HR 68 | Resp 20 | Ht 66.0 in | Wt 148.0 lb

## 2015-08-26 DIAGNOSIS — D3A00 Benign carcinoid tumor of unspecified site: Secondary | ICD-10-CM

## 2015-08-26 DIAGNOSIS — I2510 Atherosclerotic heart disease of native coronary artery without angina pectoris: Secondary | ICD-10-CM

## 2015-08-26 DIAGNOSIS — E785 Hyperlipidemia, unspecified: Secondary | ICD-10-CM

## 2015-08-26 DIAGNOSIS — I10 Essential (primary) hypertension: Secondary | ICD-10-CM | POA: Diagnosis not present

## 2015-08-26 DIAGNOSIS — Z902 Acquired absence of lung [part of]: Secondary | ICD-10-CM

## 2015-08-26 DIAGNOSIS — E279 Disorder of adrenal gland, unspecified: Secondary | ICD-10-CM | POA: Diagnosis not present

## 2015-08-26 DIAGNOSIS — E278 Other specified disorders of adrenal gland: Secondary | ICD-10-CM

## 2015-08-26 MED ORDER — IOPAMIDOL (ISOVUE-300) INJECTION 61%
100.0000 mL | Freq: Once | INTRAVENOUS | Status: AC | PRN
  Administered 2015-08-26: 100 mL via INTRAVENOUS

## 2015-08-26 NOTE — Progress Notes (Signed)
Cardiology Office Note Date:  08/26/2015   ID:  JERMANY SUNDELL, DOB 15-Jul-1934, MRN [id number]  PCP:  Steva Ready., MD  Cardiologist:  Sherren Mocha, MD    Chief Complaint  Patient presents with  . Follow-up    CAD, old MI   History of Present Illness: [name] is a 81 y.o. female who presents for follow-up evaluation, last seen one year ago. She has CAD and initially presented with an inferoposterior MI in July 2012. She underwent PCI to right coronary artery and then underwent staged PCI the left circumflex. Her LAD disease was treated medically. A followup nuclear stress scan showed no significant ischemia and her left ventricular ejection fraction was normal at 67%.  She is doing fairly well. Spending one week a month here in [place] with her son, otherwise still living independently in [place]. Today, she denies symptoms of palpitations, chest pain, shortness of breath, orthopnea, PND, lower extremity edema, dizziness, or syncope.  Past Medical History  Diagnosis Date  . Hypertension   . Dyslipidemia   . CAD (coronary artery disease)     a. 05/2011 InfPost MI;  b. 05/2011 Cath/PCI RCA - DES;  c. 05/2011 Staged PCI LCX, residual mod LAD dzs;  d. 2012 nonischemic Myoveiw, EF 67%.  . Myocardial infarction ([place])     05/2011 DR Jameca Chumley Velora Heckler )  . Heart murmur   . Shortness of breath   . Headache(784.0)   . Breast cancer ([place])   . Cancer of eye ([place])   . Lung cancer ([place])     a. 2013 bronchoscopy - right middle lobe mass + for carcinoid  . Arthritis   . Depression     Past Surgical History  Procedure Laterality Date  . Mastectomy  1990    Bilateral  . Eye surgery    . Video bronchoscopy  02/14/2012    Procedure: VIDEO BRONCHOSCOPY WITH FLUORO;  Surgeon: Kathee Delton, MD;  Location: [facility];  Service: Cardiopulmonary;  Laterality: Bilateral;  . Mass excision      NECK MASS REMOVED (BENIGN)  . Cardiac catheterization      2012 X 2 PROC DR  STUCKEY+ DR Akeem Heppler  . Flexible bronchoscopy  03/14/2012    Procedure: FLEXIBLE BRONCHOSCOPY;  Surgeon: Gaye Pollack, MD;  Location: [facility] OR;  Service: Thoracic;  Laterality: N/A;    Current Outpatient Prescriptions  Medication Sig Dispense Refill  . aspirin EC 81 MG tablet Take 81 mg by mouth daily.    . carvedilol (COREG) 6.25 MG tablet Take 6.25 mg by mouth 2 (two) times daily with a meal.    . nitroGLYCERIN (NITROSTAT) 0.4 MG SL tablet Place 0.4 mg under the tongue every 5 (five) minutes as needed. For chest pains up to 3 doses. If pain still persist call emergency personal.     . simvastatin (ZOCOR) 40 MG tablet TAKE 1 TABLET (40 MG TOTAL) BY MOUTH AT BEDTIME. 90 tablet 3   No current facility-administered medications for this visit.    Allergies:   Penicillins and Oxycodone   Social History:  The patient  reports that she quit smoking about 26 years ago. Her smoking use included Cigarettes. She has a 25 pack-year smoking history. She has never used smokeless tobacco. She reports that she does not drink alcohol or use illicit drugs.   Family History:  The patient's family history includes Emphysema in her father; Heart disease in her brother and son; Stroke in her mother.  There is no history of Coronary artery disease.    ROS:  Please see the history of present illness.  Otherwise, review of systems is positive for memory problems.  All other systems are reviewed and negative.    PHYSICAL EXAM: VS:  BP 148/82 mmHg  Pulse 74  Ht '5\' 6"'$  (1.676 m)  Wt 148 lb (67.132 kg)  BMI 23.90 kg/m2 , BMI Body mass index is 23.9 kg/(m^2). GEN: Well nourished, well developed, pleasant elderly woman in no acute distress HEENT: normal Neck: no JVD, no masses. No carotid bruits Cardiac: RRR without murmur or gallop                Respiratory:  clear to auscultation bilaterally, normal work of breathing GI: soft, nontender, nondistended, + BS MS: no deformity or atrophy Ext: no pretibial edema,  pedal pulses 2+= bilaterally Skin: warm and dry, no rash Neuro:  Strength and sensation are intact Psych: euthymic mood, full affect  EKG:  EKG is ordered today. The ekg ordered today shows normal sinus rhythm 74 bpm, nonspecific ST abnormality.  Recent Labs: No results found for requested labs within last 365 days.   Lipid Panel     Component Value Date/Time   CHOL 136 07/09/2012 0844   TRIG 89.0 07/09/2012 0844   HDL 16.40* 07/09/2012 0844   CHOLHDL 8 07/09/2012 0844   VLDL 17.8 07/09/2012 0844   LDLCALC 102* 07/09/2012 0844      Wt Readings from Last 3 Encounters:  08/26/15 148 lb (67.132 kg)  08/20/14 142 lb (64.411 kg)  08/20/14 142 lb 1.9 oz (64.465 kg)    ASSESSMENT AND PLAN: 1.  CAD, native vessel, with old MI: The patient has CCS class I symptoms. She has no angina with her normal activities. She is on appropriate medical program with aspirin, a beta blocker, and a statin drug. Labs are followed by her primary physician. I will see her back in one year. LVEF was preserved by nuclear scan assessment following her MI.  2. Hyperlipidemia: The patient is treated with a statin drug. We are going to send for her most recent labs.  3. Lung Cancer history: follow-up CT studies and appt with Dr Cyndia Bent this afternoon.   Current medicines are reviewed with the patient today.  The patient does not have concerns regarding medicines.  Labs/ tests ordered today include:  No orders of the defined types were placed in this encounter.    Disposition:   FU one year  Signed, Sherren Mocha, MD  08/26/2015 9:12 AM    [facility] [place], [place], [place]  [address] Phone: [phone]; Fax: [phone]

## 2015-08-26 NOTE — Patient Instructions (Signed)

## 2015-08-27 ENCOUNTER — Encounter: Payer: Self-pay | Admitting: Surgery

## 2015-08-27 NOTE — Progress Notes (Signed)
HPI:  The patient returns today for followup status post right thoracotomy with right middle and lower lobectomy on 03/14/2012 for a T2A, N0 well-differentiated neuroendocrine tumor. She has aright adrenal mass that was seen on her initial CT but was not felt to be hypermetabolic on her initial PET scan so we followed it. It remained unchanged on her CT from 02/12/2014 compared to her scan in October 2014 but slightly larger compared to the scan in April 2013.  She had a CT guided needle biopsy of this on 12/03/2012 because on further review radiology felt that the lesion was hypermetabolic on the initial PET scan with an SUV of 5. The biopsy showed:   Diagnosis Adrenal gland, biopsy, Right - SMALL FRAGMENT OF BENIGN ADRENAL GLAND TISSUE AND ADJACENT BENIGN VASCULAR PROLIFERATION. - NO EVIDENCE OF NEUROENDOCRINE TUMOR OR MALIGNANCY. - PLEASE SEE COMMENT. Microscopic Comment The biopsies are composed of small fragment of benign adrenal gland and adjacent benign vascular proliferation; the morphologic features are mostly consistent with hemangioma. There is no evidence of atypia, neuroendocrine tumor of malignancy identified. Clinical and radiographic correlation is highly recommended. (HCL:caf 12/04/12) Aldona Bar MD Pathologist, Electronic Signature (Case signed 12/04/2012) Specimen Gross and Clinical Information Specimen(s) Obtained: Adrenal gland, biopsy, Right Specimen Clinical   She's doing well. She has had no chest pain. She denies any cough, sputum production, or hemoptysis. Her weight has been stable. Appetite is good. She denies any headache or visual changes.   Current Outpatient Prescriptions  Medication Sig Dispense Refill  . aspirin EC 81 MG tablet Take 81 mg by mouth daily.    . carvedilol (COREG) 6.25 MG tablet Take 6.25 mg by mouth 2 (two) times daily with a meal.    . nitroGLYCERIN (NITROSTAT) 0.4 MG SL tablet Place 0.4 mg under the tongue every 5 (five)  minutes as needed. For chest pains up to 3 doses. If pain still persist call emergency personal.     . simvastatin (ZOCOR) 40 MG tablet TAKE 1 TABLET (40 MG TOTAL) BY MOUTH AT BEDTIME. 90 tablet 3   No current facility-administered medications for this visit.     Physical Exam:  BP 180/80 mmHg  Pulse 68  Resp 20  Ht '5\' 6"'$  (1.676 m)  Wt 148 lb (67.132 kg)  BMI 23.90 kg/m2  SpO2 99% She looks well.  Lung exam is clear.  The right thoracotomy scar is well-healed. There are no skin lesions.  There is no cervical or supraclavicular adenopathy.  Abdominal exam shows active bowel sounds. Abdomen is soft and nontender with no palpable masses organomegaly.    Diagnostic Tests:  ADDENDUM REPORT: 08/26/2015 16:51 ADDENDUM: Correction: The fourth impression should state " Progressive mild enlargement of the RIGHT adrenal lesion". In review of the chart, lesion was biopsied on 12/03/2012 with negative results. If future biopsy is planned, recommend FDG PET scan prior to biopsy to target a potential metabolic region of the gland - lesion. Electronically Signed  By: Suzy Bouchard M.D.  On: 08/26/2015 16:51     Study Result     CLINICAL DATA: History of RIGHT lung resection, bilateral breast cancer with mastectomy, and adrenal mass. Additional history of carcinoid tumor.  EXAM: CT CHEST WITH CONTRAST  CT ABDOMEN AND PELVIS WITH AND WITHOUT CONTRAST  TECHNIQUE: Multidetector CT imaging of the chest was performed during intravenous contrast administration. Multidetector CT imaging of the abdomen and pelvis was performed following the standard protocol before and during bolus administration of intravenous  contrast.  CONTRAST: 159m ISOVUE-300 IOPAMIDOL (ISOVUE-300) INJECTION 61%  COMPARISON: CT 08/20/2014  FINDINGS: CT CHEST FINDINGS  Mediastinum/Nodes: No axillary or supraclavicular lymphadenopathy. Bilateral axillary lymphadenectomy and  mastectomies.  No mediastinal lymphadenopathy. No internal mammary adenopathy. No hilar adenopathy. No pericardial fluid. Coronary calcifications are noted.  Lungs/Pleura: There is a surgical margin in the RIGHT hemi thorax along the suprahilar mediastinal border. There is no nodularity within the RIGHT lung. No nodularity within the LEFT lung.  Musculoskeletal: No aggressive osseous lesion.  CT ABDOMEN PELVIS FINDINGS  Hepatobiliary: No focal hepatic lesion. The gallbladder is normal. No biliary duct dilatation.  Pancreas: Pancreas is normal. No ductal dilatation. No pancreatic inflammation.  Spleen: Normal spleen  Adrenals/Urinary Tract: Enlargement of the RIGHT adrenal gland is again demonstrated measuring 4.3 by 3.3 cm compared to 4.1 x 3.2 cm on 08/20/2014 and 3.9 x 3.1 cm on CT of 08/14/2013. On the noncontrast CT the lesion has intermediate density at HU equal 29 to 35 HU. There is minimal postcontrast enhancement which HU equal 37 to 42. Lesion does not washout significantly. LEFT adrenal gland normal.  The RIGHT kidney is malrotated. No enhancing renal lesion. The ureters are normal.  Stomach/Bowel: Stomach, small-bowel, and limited view of the colon demonstrate diverticula of the LEFT colon.  Vascular/Lymphatic: Abdominal aorta is normal caliber with atherosclerotic calcification. There is no retroperitoneal or periportal lymphadenopathy. No pelvic lymphadenopathy.  Other: No mesenteric or peritoneal disease  Musculoskeletal: No aggressive osseous lesion.  IMPRESSION: 1. No evidence of tumor recurrence within the RIGHT lung. 2. No mediastinal lymphadenopathy. 3. Bilateral mastectomies and axillary dissections. 4. Progressive mild enlargement of the LEFT adrenal lesion with mild post-contrast enhancement. A FDG PET scan may add information as to malignant potential of this lesion. A contrast MRI may add information as to benignancy. Tissue  sampling may be necessary.  Electronically Signed: By: SSuzy BouchardM.D. On: 08/26/2015 16:31      Impression:  She is doing well overall with no evidence of recurrent neuroendocrine tumor of the lung. She does have progressive mild enlargement of the right adrenal gland but this was biopsied in 2014 and showed only benign adrenal gland tissue and benign vascular proliferation suggesting a hemangioma. I don't think another biopsy is needed at this time.  Plan:  I will see her back in one year with a CT of the chest and abdomen.   BGaye Pollack MD [facility] ([phone]

## 2015-09-04 ENCOUNTER — Encounter: Payer: Self-pay | Admitting: Cardiovascular Disease

## 2016-07-19 ENCOUNTER — Other Ambulatory Visit: Payer: Self-pay | Admitting: *Deleted

## 2016-07-19 DIAGNOSIS — D1431 Benign neoplasm of right bronchus and lung: Secondary | ICD-10-CM

## 2016-08-24 ENCOUNTER — Encounter: Payer: Medicare Other | Admitting: Surgery

## 2016-08-30 ENCOUNTER — Other Ambulatory Visit: Payer: Self-pay | Admitting: *Deleted

## 2016-08-30 DIAGNOSIS — D3A090 Benign carcinoid tumor of the bronchus and lung: Secondary | ICD-10-CM

## 2016-08-31 ENCOUNTER — Other Ambulatory Visit: Payer: Medicare Other

## 2016-08-31 ENCOUNTER — Encounter: Payer: Medicare Other | Admitting: Surgery

## 2016-09-28 ENCOUNTER — Inpatient Hospital Stay: Admission: RE | Admit: 2016-09-28 | Payer: Medicare Other | Source: Ambulatory Visit

## 2016-09-28 ENCOUNTER — Other Ambulatory Visit: Payer: Medicare Other

## 2016-09-28 ENCOUNTER — Encounter: Payer: Medicare Other | Admitting: Surgery

## 2016-10-04 ENCOUNTER — Encounter: Payer: Medicare Other | Admitting: Surgery

## 2020-10-31 DEATH — deceased

## 2020-12-01 DEATH — deceased
